# Patient Record
Sex: Male | Born: 1935 | Race: White | Hispanic: No | Marital: Married | Smoking: Never smoker
Health system: Southern US, Community
[De-identification: ages and names within clinical notes are randomized; demographics above are authoritative.]

## PROBLEM LIST (undated history)

## (undated) DIAGNOSIS — K439 Ventral hernia without obstruction or gangrene: Secondary | ICD-10-CM

## (undated) DIAGNOSIS — Z9889 Other specified postprocedural states: Secondary | ICD-10-CM

## (undated) DIAGNOSIS — Z7901 Long term (current) use of anticoagulants: Secondary | ICD-10-CM

## (undated) DIAGNOSIS — Z8719 Personal history of other diseases of the digestive system: Secondary | ICD-10-CM

## (undated) DIAGNOSIS — C61 Malignant neoplasm of prostate: Secondary | ICD-10-CM

## (undated) DIAGNOSIS — I480 Paroxysmal atrial fibrillation: Secondary | ICD-10-CM

## (undated) DIAGNOSIS — I493 Ventricular premature depolarization: Secondary | ICD-10-CM

## (undated) DIAGNOSIS — R5383 Other fatigue: Secondary | ICD-10-CM

## (undated) HISTORY — DX: Personal history of other diseases of the digestive system: Z87.19

## (undated) HISTORY — DX: Other specified postprocedural states: Z98.890

## (undated) HISTORY — DX: Other fatigue: R53.83

## (undated) HISTORY — PX: HERNIA REPAIR: SHX51

## (undated) HISTORY — DX: Long term (current) use of anticoagulants: Z79.01

## (undated) HISTORY — DX: Ventral hernia without obstruction or gangrene: K43.9

## (undated) HISTORY — DX: Malignant neoplasm of prostate: C61

## (undated) HISTORY — DX: Paroxysmal atrial fibrillation: I48.0

## (undated) HISTORY — DX: Ventricular premature depolarization: I49.3

---

## 1995-08-20 DIAGNOSIS — Z9889 Other specified postprocedural states: Secondary | ICD-10-CM

## 1995-08-20 HISTORY — DX: Other specified postprocedural states: Z98.890

## 1995-08-20 HISTORY — PX: MITRAL VALVE REPAIR: SHX2039

## 2001-08-19 HISTORY — PX: RADIOACTIVE SEED IMPLANT: SHX5150

## 2003-11-24 ENCOUNTER — Encounter: Admission: RE | Admit: 2003-11-24 | Discharge: 2003-11-24 | Payer: Self-pay | Admitting: Urology

## 2003-11-24 ENCOUNTER — Ambulatory Visit: Admission: RE | Admit: 2003-11-24 | Discharge: 2004-01-20 | Payer: Self-pay | Admitting: Radiation Oncology

## 2003-12-20 ENCOUNTER — Ambulatory Visit (HOSPITAL_BASED_OUTPATIENT_CLINIC_OR_DEPARTMENT_OTHER): Admission: RE | Admit: 2003-12-20 | Discharge: 2003-12-20 | Payer: Self-pay | Admitting: Urology

## 2003-12-20 ENCOUNTER — Ambulatory Visit (HOSPITAL_COMMUNITY): Admission: RE | Admit: 2003-12-20 | Discharge: 2003-12-20 | Payer: Self-pay | Admitting: Urology

## 2005-10-18 IMAGING — CR DG CHEST 2V
2 series · 2 of 2 positions shown · non-contrast
Comparison: none

CLINICAL DATA: Congestion, cough.  Fever.  Preop respiratory exam for prostate cancer.  CABG.  Former smoker.
CHEST, TWO VIEWS ? 11/24/03 
7 mm ovoid, benign bone island at the posterior right fourth rib or calcified granuloma seen.  Submaximal inspiration is seen with the lungs otherwise clear.  itral valve replacement and sternal wire sutures are noted.  Heart size is normal.  Mediastinum, hila, pleura, and osseous structures are unremarkable.  
IMPRESSION
1.  Right upper lobe benign bone island in rib or calcified granuloma. 
2.  Probable mitral valve replacement.
3.  Otherwise no active disease.

[view not recorded (1 of 2)]
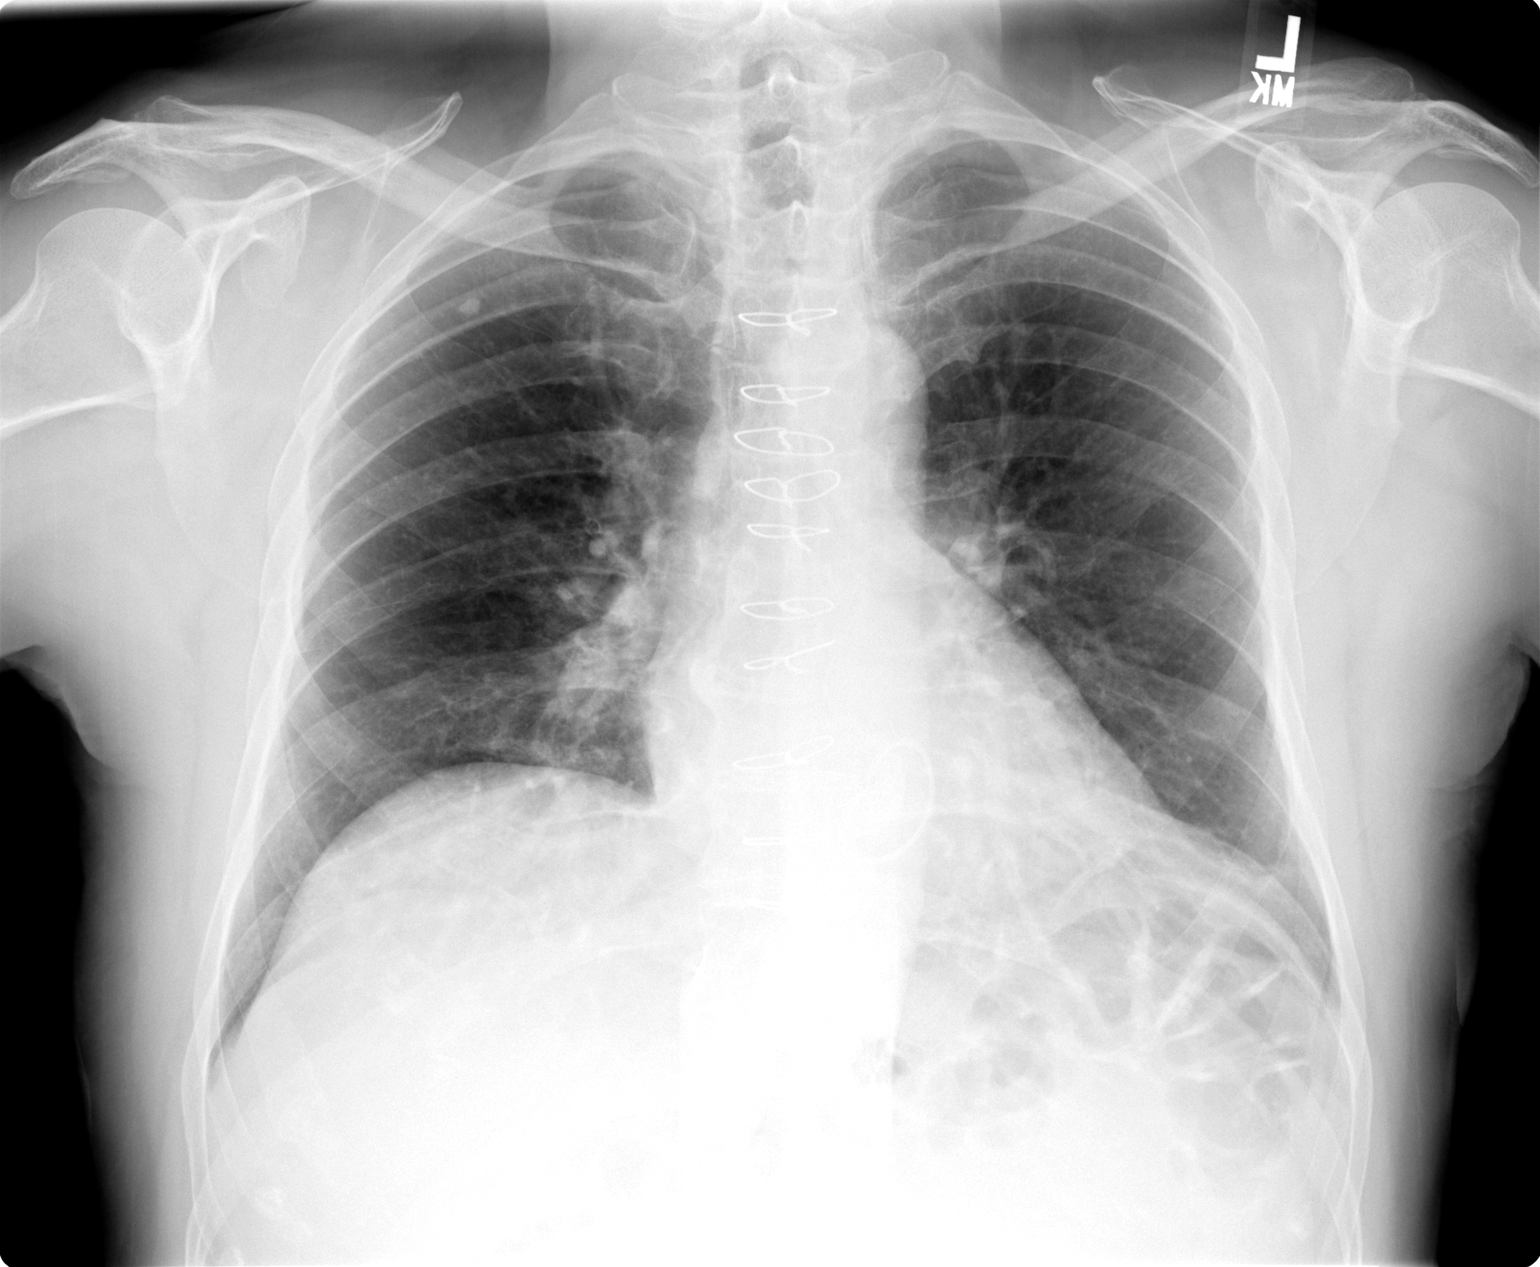

[view not recorded (2 of 2)]
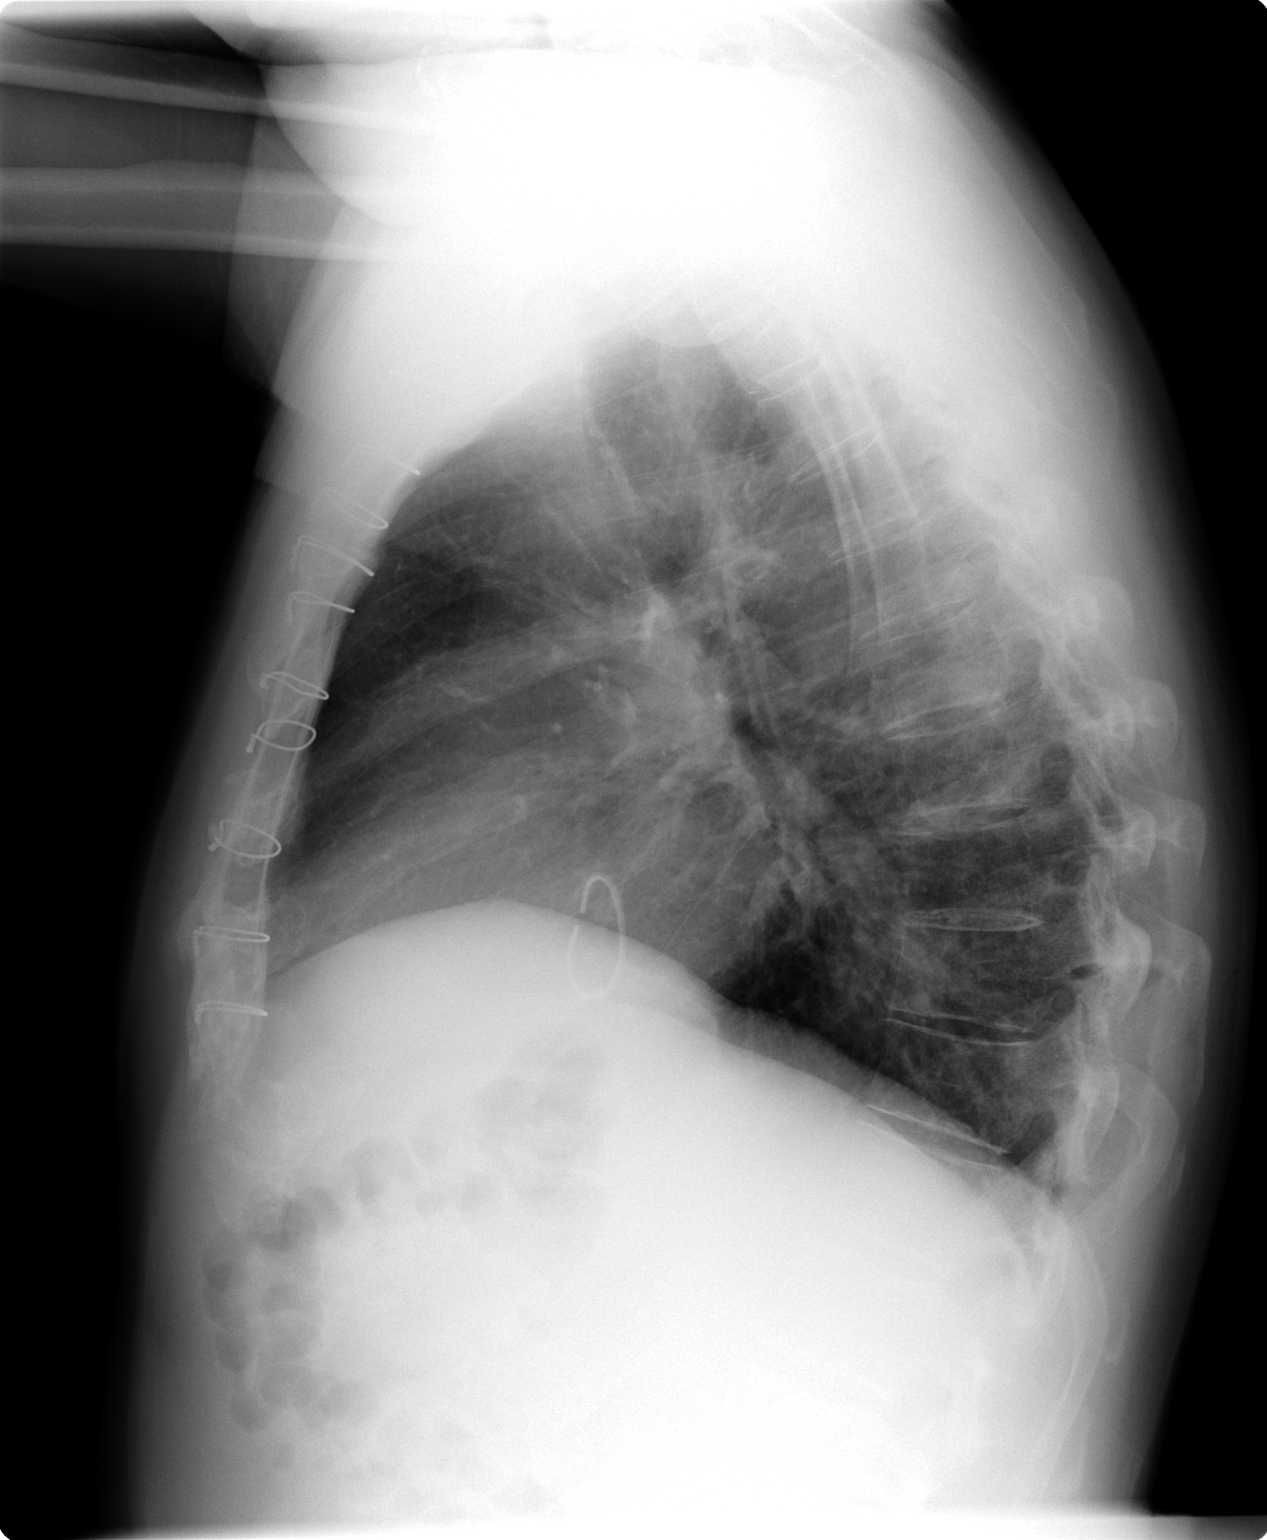

[2 of 2 positions shown; findings below may reference images not displayed]

## 2011-11-08 ENCOUNTER — Encounter (INDEPENDENT_AMBULATORY_CARE_PROVIDER_SITE_OTHER): Payer: Self-pay | Admitting: Surgery

## 2011-11-08 ENCOUNTER — Ambulatory Visit (INDEPENDENT_AMBULATORY_CARE_PROVIDER_SITE_OTHER): Payer: Medicare Other | Admitting: Surgery

## 2011-11-08 VITALS — BP 126/86 | HR 64 | Temp 97.8°F | Resp 12 | Ht 69.0 in | Wt 174.2 lb

## 2011-11-08 DIAGNOSIS — M6208 Separation of muscle (nontraumatic), other site: Secondary | ICD-10-CM

## 2011-11-08 DIAGNOSIS — M6200 Separation of muscle (nontraumatic), unspecified site: Secondary | ICD-10-CM

## 2011-11-08 NOTE — Progress Notes (Signed)
  CC: Possible hernia HPI: This patient had a "flu" illness a few months ago and was doing a lot of coughing. After that he developed were noted a bulge in the epigastric area. If this is actually gotten smaller since he first noticed it. There is no pain. He also has a small "bubble" at the very bottom of a midline sternotomy incision. This has been unchanged since his surgery in 1996 and causes him no problems.   ROS: See EMR  MEDS:  Current Outpatient Prescriptions  Medication Sig Dispense Refill  . allopurinol (ZYLOPRIM) 100 MG tablet daily.      Marland Kitchen esomeprazole (NEXIUM) 20 MG capsule Take 20 mg by mouth as needed.      Marland Kitchen LOVAZA 1 G capsule Three times a day.      . sotalol (BETAPACE) 80 MG tablet BID times 48H.      . warfarin (COUMADIN) 6 MG tablet daily.        ALLERGIES: No Known Allergies    PE VS: BP 126/86  Pulse 64  Temp(Src) 97.8 F (36.6 C) (Temporal)  Resp 12  Ht 5\' 9"  (1.753 m)  Wt 174 lb 3.2 oz (79.017 kg)  BMI 25.72 kg/m2  Gen.: The patient is alert oriented and healthy-appearing Abdomen: The abdomen is soft and completely benign. There are no masses or organomegaly. The area of concern that he points out is diastases recti. There is also a small perhaps 1 cm possible hernia at the bottom of his sternotomy incision.  Data Reviewed No outside information  Assessment Asymptomatic diastases recti  Plan I think he can have normal activities. No surgical intervention is required. []

## 2011-11-08 NOTE — Patient Instructions (Signed)
You may have normal activities and no restrictions

## 2012-04-16 ENCOUNTER — Encounter: Payer: Self-pay | Admitting: Nurse Practitioner

## 2012-04-16 ENCOUNTER — Other Ambulatory Visit: Payer: Self-pay | Admitting: Cardiology

## 2012-04-16 ENCOUNTER — Encounter: Payer: Self-pay | Admitting: Cardiology

## 2012-04-16 ENCOUNTER — Ambulatory Visit (INDEPENDENT_AMBULATORY_CARE_PROVIDER_SITE_OTHER): Payer: Medicare Other | Admitting: Cardiology

## 2012-04-16 VITALS — BP 140/70 | HR 56 | Ht 69.0 in | Wt 170.8 lb

## 2012-04-16 DIAGNOSIS — R0609 Other forms of dyspnea: Secondary | ICD-10-CM

## 2012-04-16 DIAGNOSIS — R5383 Other fatigue: Secondary | ICD-10-CM

## 2012-04-16 DIAGNOSIS — Z9889 Other specified postprocedural states: Secondary | ICD-10-CM

## 2012-04-16 DIAGNOSIS — R0600 Dyspnea, unspecified: Secondary | ICD-10-CM | POA: Insufficient documentation

## 2012-04-16 DIAGNOSIS — I4949 Other premature depolarization: Secondary | ICD-10-CM

## 2012-04-16 DIAGNOSIS — I4891 Unspecified atrial fibrillation: Secondary | ICD-10-CM

## 2012-04-16 DIAGNOSIS — I493 Ventricular premature depolarization: Secondary | ICD-10-CM

## 2012-04-16 DIAGNOSIS — E785 Hyperlipidemia, unspecified: Secondary | ICD-10-CM

## 2012-04-16 DIAGNOSIS — R5381 Other malaise: Secondary | ICD-10-CM

## 2012-04-16 LAB — BASIC METABOLIC PANEL
Calcium: 9.1 mg/dL (ref 8.4–10.5)
Glucose, Bld: 89 mg/dL (ref 70–99)
Potassium: 4.2 mEq/L (ref 3.5–5.1)
Sodium: 138 mEq/L (ref 135–145)

## 2012-04-16 LAB — CBC WITH DIFFERENTIAL/PLATELET
Basophils Absolute: 0 10*3/uL (ref 0.0–0.1)
Hemoglobin: 14.7 g/dL (ref 13.0–17.0)
Lymphocytes Relative: 19.2 % (ref 12.0–46.0)
Lymphs Abs: 1.2 10*3/uL (ref 0.7–4.0)
MCHC: 32.8 g/dL (ref 30.0–36.0)
RBC: 4.95 Mil/uL (ref 4.22–5.81)
RDW: 13.6 % (ref 11.5–14.6)

## 2012-04-16 LAB — PROTIME-INR
INR: 2.46 — ABNORMAL HIGH (ref <1.50)
Prothrombin Time: 27.1 seconds — ABNORMAL HIGH (ref 11.6–15.2)

## 2012-04-16 NOTE — Progress Notes (Signed)
 Ronald Horton Date of Birth: 08/27/1934 Medical Record #[id number]  History of Present Illness: Ronald Horton is a pleasant 76-year-old white male who is self referred for cardiac evaluation. He is a pleasant 76-year-old white male with history of mitral valve prolapse. He is status post mitral valve repair in 1997 at [facility] by Dr. Crosby. He states he did very well with that. He has had a history of atrial fibrillation and was on sotalol 80 mg twice a day until recently. Over the past 2 years he has complained of progressively worse symptoms of palpitations and more recently shortness of breath. With his palpitations he does experience some dizziness and has noticed a significant decrease in his energy. He denies any true syncope. He has had extensive evaluation this year. This includes a stress Myoview study which was felt to be low risk. He has had 3 echocardiograms. These have demonstrated LVH with normal systolic function and elevated left ventricular filling pressures. His mitral valve repair shows no significant regurgitation but he does have leaflet thickening with mild to moderate mitral stenosis. Mean gradients have been from 4.5-7 mm of mercury. Right ventricular systolic pressures have been normal. He also had a Holter monitor placed which demonstrated frequent PVCs with couplets and triplets. There is no evidence of atrial fibrillation. About 2-3 weeks ago he was taken off of sotalol and placed on metoprolol. He thinks his heart rate has actually been slower on this medication and he has noticed no change in his palpitations or other symptoms. He was being evaluated by cardiologist in [place] who recommended cardiac catheterization. The patient desires a second opinion and wished to be treated here.   Current Outpatient Prescriptions on File Prior to Visit  Medication Sig Dispense Refill  . allopurinol (ZYLOPRIM) 100 MG tablet daily.      . esomeprazole  (NEXIUM) 20 MG capsule Take 20 mg by mouth as needed.      . LOVAZA 1 G capsule Three times a day.      . metoprolol (LOPRESSOR) 50 MG tablet Take 75 mg by mouth 2 (two) times daily.      . warfarin (COUMADIN) 6 MG tablet daily.        No Known Allergies  Past Medical History  Diagnosis Date  . Prostate cancer   . PAF (paroxysmal atrial fibrillation)   . History of inguinal hernia     bilateral  . Ventral hernia   . H/O mitral valve repair 1997    with ring  . PVC's (premature ventricular contractions)   . Fatigue   . Chronic anticoagulation     Past Surgical History  Procedure Date  . Mitral valve repair 1997  . Radioactive seed implant 2003  . Hernia repair     BIH    History  Smoking status  . Never Smoker   Smokeless tobacco  . Not on file    History  Alcohol Use  . Yes    Family History  Problem Relation Age of Onset  . Cancer Father     prostate  . Heart disease Father   . Cancer Sister     colon    Review of Systems:  as noted in history of present illness.he reports his last episode of atrial fibrillation was last April. This converted spontaneously. He has never required a DC cardioversion.   All other systems were reviewed and are negative.  Physical Exam: BP 140/70  Pulse 56  Ht 5'   9" (1.753 m)  Wt 170 lb 12.8 oz (77.474 kg)  BMI 25.22 kg/m2  he is a pleasant white male who appears in good health. The patient is alert and oriented x 3.  The mood and affect are normal.  The skin is warm and dry.  Color is normal.  The HEENT exam reveals that the sclera are nonicteric.  The mucous membranes are moist.  The carotids are 2+ without bruits.  There is no thyromegaly.  There is no JVD.  The lungs are clear.  The chest wall is non tender.  The heart exam reveals a regular rate with a normal S1 and S2.  There are no murmurs, gallops, or rubs.  The PMI is not displaced. He has a median sternotomy scar.  Abdominal exam reveals good bowel sounds.  There is  no guarding or rebound.  There is no hepatosplenomegaly or tenderness.  There are no masses.  Exam of the legs reveal no clubbing, cyanosis, or edema.  The legs are without rashes.  The distal pulses are intact.  Cranial nerves II - XII are intact.  Motor and sensory functions are intact.  The gait is normal.  LABORATORY DATA:  ECG demonstrates sinus bradycardia with a rate of 52 and frequent unifocal PVCs. It is otherwise normal.   outside data also indicates a chest CT which showed a calcified granuloma in the right upper lobe. The heart was described as enlarged. No other significant findings. CT of the abdomen was also unremarkable.   Assessment /Plan:  1. PVCs, symptomatic. These are frequent and appeared to be unifocal. He has couplets and triplets. These have not been responsive to beta blocker therapy or sotalol. Depending on the results of his cardiac catheterization will consider referral to EP.  2. Atrial fibrillation. Currently patient is maintaining sinus rhythm off of sotalol.  3. Symptoms of increased dyspnea and fatigue. Need to rule out coronary ischemia. Also further need to evaluate the possibility of mitral stenosis. Right and left heart catheterization has been recommended the patient be scheduled for this procedure next Tuesday. We will need to hold his Coumadin for this procedure.  4. Status post mitral valve repair. Next  5. Hyperlipidemia.   

## 2012-04-17 ENCOUNTER — Telehealth: Payer: Self-pay

## 2012-04-17 DIAGNOSIS — I4891 Unspecified atrial fibrillation: Secondary | ICD-10-CM

## 2012-04-17 NOTE — Telephone Encounter (Signed)
Patient called was told needs to have stat INR done Tuesday 04/21/12 at 8:30 am at Dr.Jordan's office,so results will be ready by cath at 11:30 am.

## 2012-04-21 ENCOUNTER — Encounter (HOSPITAL_BASED_OUTPATIENT_CLINIC_OR_DEPARTMENT_OTHER)
Admission: RE | Disposition: A | Discharge status: Home / Self Care | Payer: Self-pay | Source: Ambulatory Visit | Attending: Cardiology

## 2012-04-21 ENCOUNTER — Inpatient Hospital Stay (HOSPITAL_BASED_OUTPATIENT_CLINIC_OR_DEPARTMENT_OTHER)
Admission: RE | Admit: 2012-04-21 | Discharge: 2012-04-21 | Disposition: A | Discharge status: Home / Self Care | Payer: Medicare Other | Source: Ambulatory Visit | Attending: Cardiology | Admitting: Cardiology

## 2012-04-21 ENCOUNTER — Other Ambulatory Visit: Payer: Medicare Other

## 2012-04-21 ENCOUNTER — Ambulatory Visit (INDEPENDENT_AMBULATORY_CARE_PROVIDER_SITE_OTHER): Payer: Medicare Other | Admitting: *Deleted

## 2012-04-21 DIAGNOSIS — I4891 Unspecified atrial fibrillation: Secondary | ICD-10-CM | POA: Insufficient documentation

## 2012-04-21 DIAGNOSIS — I050 Rheumatic mitral stenosis: Secondary | ICD-10-CM | POA: Insufficient documentation

## 2012-04-21 DIAGNOSIS — I059 Rheumatic mitral valve disease, unspecified: Secondary | ICD-10-CM

## 2012-04-21 DIAGNOSIS — R0600 Dyspnea, unspecified: Secondary | ICD-10-CM

## 2012-04-21 DIAGNOSIS — I4949 Other premature depolarization: Secondary | ICD-10-CM | POA: Insufficient documentation

## 2012-04-21 DIAGNOSIS — Z7901 Long term (current) use of anticoagulants: Secondary | ICD-10-CM | POA: Insufficient documentation

## 2012-04-21 DIAGNOSIS — Z952 Presence of prosthetic heart valve: Secondary | ICD-10-CM | POA: Insufficient documentation

## 2012-04-21 LAB — POCT I-STAT 3, ART BLOOD GAS (G3+)
Bicarbonate: 25.6 mEq/L — ABNORMAL HIGH (ref 20.0–24.0)
O2 Saturation: 93 %
TCO2: 27 mmol/L (ref 0–100)
pCO2 arterial: 45.4 mmHg — ABNORMAL HIGH (ref 35.0–45.0)
pO2, Arterial: 69 mmHg — ABNORMAL LOW (ref 80.0–100.0)

## 2012-04-21 LAB — POCT I-STAT 3, VENOUS BLOOD GAS (G3P V)
Acid-base deficit: 2 mmol/L (ref 0.0–2.0)
O2 Saturation: 67 %

## 2012-04-21 LAB — PROTIME-INR: INR: 1 ratio (ref 0.8–1.0)

## 2012-04-21 SURGERY — JV LEFT AND RIGHT HEART CATHETERIZATION WITH CORONARY ANGIOGRAM
Anesthesia: Moderate Sedation

## 2012-04-21 MED ORDER — DIAZEPAM 5 MG PO TABS
5.0000 mg | ORAL_TABLET | ORAL | Status: AC
  Administered 2012-04-21: 5 mg via ORAL

## 2012-04-21 MED ORDER — SODIUM CHLORIDE 0.9 % IV SOLN: 250.0000 mL | INTRAVENOUS | Status: DC | PRN

## 2012-04-21 MED ORDER — SODIUM CHLORIDE 0.9 % IV SOLN
INTRAVENOUS | Status: DC
  Administered 2012-04-21: 10:00:00 via INTRAVENOUS

## 2012-04-21 MED ORDER — ACETAMINOPHEN 325 MG PO TABS: 650.0000 mg | ORAL_TABLET | ORAL | Status: DC | PRN

## 2012-04-21 MED ORDER — SODIUM CHLORIDE 0.9 % IV SOLN: 1.0000 mL/kg/h | INTRAVENOUS | Status: DC

## 2012-04-21 MED ORDER — SODIUM CHLORIDE 0.9 % IJ SOLN: 3.0000 mL | Freq: Two times a day (BID) | INTRAMUSCULAR | Status: DC

## 2012-04-21 MED ORDER — ONDANSETRON HCL 4 MG/2ML IJ SOLN: 4.0000 mg | Freq: Four times a day (QID) | INTRAMUSCULAR | Status: DC | PRN

## 2012-04-21 MED ORDER — SODIUM CHLORIDE 0.9 % IJ SOLN: 3.0000 mL | INTRAMUSCULAR | Status: DC | PRN

## 2012-04-21 NOTE — CV Procedure (Signed)
   Cardiac Catheterization Procedure Note  Name: Ronald Horton MRN: [id number] DOB: 1935/06/23  Procedure: Right Heart Cath, Left Heart Cath, Selective Coronary Angiography, LV angiography  Indication: 77 year old white male status post mitral valve repair for mitral valve prolapse presents with increased symptoms related to frequent PVCs. Echocardiogram that demonstrated mild to moderate mitral stenosis.   Procedural Details: The right groin was prepped, draped, and anesthetized with 1% lidocaine. Using the modified Seldinger technique a 4 French sheath was placed in the right femoral artery and a 7 French sheath was placed in the right femoral vein. A Swan-Ganz catheter was used for the right heart catheterization. Standard protocol was followed for recording of right heart pressures and sampling of oxygen saturations. Fick cardiac output was calculated. Standard Judkins catheters were used for selective coronary angiography and left ventriculography. There were no immediate procedural complications. The patient was transferred to the post catheterization recovery area for further monitoring.  Procedural Findings: Hemodynamics RA 7/7 with a mean of 4 mmHg RV 37/6 mmHg PA 38/10 with a mean of 23 mmHg PCWP 18/24 with a mean of 16 mmHg LV 127/17 mmHg AO 129/62 with a mean of 87 mmHg  Mean mitral valve gradient is 3 mmHg and calculated valve area of 1.41 cm square.  Oxygen saturations: PA 67% AO 93%  Cardiac Output (Fick) 4.6 L per minute  Cardiac Index (Fick) 2.4 L per minute per meter square   Coronary angiography: Coronary dominance: right  Left mainstem: Normal  Left anterior descending (LAD): Normal  Left circumflex (LCx): Normal  Right coronary artery (RCA): Normal  Left ventriculography: Left ventricular systolic function is normal, LVEF is estimated at 55-65%, there is no significant mitral regurgitation   Final Conclusions:   1. Normal coronary anatomy. 2. Normal  left ventricular function. 3. Normal right heart pressures. 4. Mild to moderate mitral stenosis. 5. No significant mitral insufficiency.  Recommendations: Continue medical management. Consider EP evaluation for management of PVCs.   Theron Arista [facility] 04/21/2012, 12:08 PM

## 2012-04-21 NOTE — H&P (View-Only) (Signed)
Kristin Bruins Date of Birth: 1935-07-14 Medical Record #[id number]  History of Present Illness: Mr. Diloreto is a pleasant 77 year old white male who is self referred for cardiac evaluation. He is a pleasant 77 year old white male with history of mitral valve prolapse. He is status post mitral valve repair in 1997 at [facility] by Dr. Esmeralda Arthur. He states he did very well with that. He has had a history of atrial fibrillation and was on sotalol 80 mg twice a day until recently. Over the past 2 years he has complained of progressively worse symptoms of palpitations and more recently shortness of breath. With his palpitations he does experience some dizziness and has noticed a significant decrease in his energy. He denies any true syncope. He has had extensive evaluation this year. This includes a stress Myoview study which was felt to be low risk. He has had 3 echocardiograms. These have demonstrated LVH with normal systolic function and elevated left ventricular filling pressures. His mitral valve repair shows no significant regurgitation but he does have leaflet thickening with mild to moderate mitral stenosis. Mean gradients have been from 4.5-7 mm of mercury. Right ventricular systolic pressures have been normal. He also had a Holter monitor placed which demonstrated frequent PVCs with couplets and triplets. There is no evidence of atrial fibrillation. About 2-3 weeks ago he was taken off of sotalol and placed on metoprolol. He thinks his heart rate has actually been slower on this medication and he has noticed no change in his palpitations or other symptoms. He was being evaluated by cardiologist in [place] who recommended cardiac catheterization. The patient desires a second opinion and wished to be treated here.   Current Outpatient Prescriptions on File Prior to Visit  Medication Sig Dispense Refill  . allopurinol (ZYLOPRIM) 100 MG tablet daily.      Marland Kitchen esomeprazole  (NEXIUM) 20 MG capsule Take 20 mg by mouth as needed.      Marland Kitchen LOVAZA 1 G capsule Three times a day.      . metoprolol (LOPRESSOR) 50 MG tablet Take 75 mg by mouth 2 (two) times daily.      Marland Kitchen warfarin (COUMADIN) 6 MG tablet daily.        No Known Allergies  Past Medical History  Diagnosis Date  . Prostate cancer   . PAF (paroxysmal atrial fibrillation)   . History of inguinal hernia     bilateral  . Ventral hernia   . H/O mitral valve repair 1997    with ring  . PVC's (premature ventricular contractions)   . Fatigue   . Chronic anticoagulation     Past Surgical History  Procedure Date  . Mitral valve repair 1997  . Radioactive seed implant 2003  . Hernia repair     BIH    History  Smoking status  . Never Smoker   Smokeless tobacco  . Not on file    History  Alcohol Use  . Yes    Family History  Problem Relation Age of Onset  . Cancer Father     prostate  . Heart disease Father   . Cancer Sister     colon    Review of Systems:  as noted in history of present illness.he reports his last episode of atrial fibrillation was last April. This converted spontaneously. He has never required a DC cardioversion.   All other systems were reviewed and are negative.  Physical Exam: BP 140/70  Pulse 56  Ht 5'  9" (1.753 m)  Wt 170 lb 12.8 oz (77.474 kg)  BMI 25.22 kg/m2  he is a pleasant white male who appears in good health. The patient is alert and oriented x 3.  The mood and affect are normal.  The skin is warm and dry.  Color is normal.  The HEENT exam reveals that the sclera are nonicteric.  The mucous membranes are moist.  The carotids are 2+ without bruits.  There is no thyromegaly.  There is no JVD.  The lungs are clear.  The chest wall is non tender.  The heart exam reveals a regular rate with a normal S1 and S2.  There are no murmurs, gallops, or rubs.  The PMI is not displaced. He has a median sternotomy scar.  Abdominal exam reveals good bowel sounds.  There is  no guarding or rebound.  There is no hepatosplenomegaly or tenderness.  There are no masses.  Exam of the legs reveal no clubbing, cyanosis, or edema.  The legs are without rashes.  The distal pulses are intact.  Cranial nerves II - XII are intact.  Motor and sensory functions are intact.  The gait is normal.  LABORATORY DATA:  ECG demonstrates sinus bradycardia with a rate of 52 and frequent unifocal PVCs. It is otherwise normal.   outside data also indicates a chest CT which showed a calcified granuloma in the right upper lobe. The heart was described as enlarged. No other significant findings. CT of the abdomen was also unremarkable.   Assessment /Plan:  1. PVCs, symptomatic. These are frequent and appeared to be unifocal. He has couplets and triplets. These have not been responsive to beta blocker therapy or sotalol. Depending on the results of his cardiac catheterization will consider referral to EP.  2. Atrial fibrillation. Currently patient is maintaining sinus rhythm off of sotalol.  3. Symptoms of increased dyspnea and fatigue. Need to rule out coronary ischemia. Also further need to evaluate the possibility of mitral stenosis. Right and left heart catheterization has been recommended the patient be scheduled for this procedure next Tuesday. We will need to hold his Coumadin for this procedure.  4. Status post mitral valve repair. Next  5. Hyperlipidemia.

## 2012-04-21 NOTE — Interval H&P Note (Signed)
History and Physical Interval Note:  04/21/2012 11:27 AM  Ronald Horton  has presented today for surgery, with the diagnosis of sob  The various methods of treatment have been discussed with the patient and family. After consideration of risks, benefits and other options for treatment, the patient has consented to  Procedure(s) (LRB) with comments: JV LEFT AND RIGHT HEART CATHETERIZATION WITH CORONARY ANGIOGRAM (N/A) as a surgical intervention .  The patient's history has been reviewed, patient examined, no change in status, stable for surgery.  I have reviewed the patient's chart and labs.  Questions were answered to the patient's satisfaction.     Theron Arista [facility] 04/21/2012 11:27 AM

## 2012-04-21 NOTE — Progress Notes (Signed)
Bedrest begins @ 1220. Tegaderm dressing applied to right groin which is level 0 by Venda Rodes.

## 2012-05-04 ENCOUNTER — Encounter: Payer: Medicare Other | Admitting: Nurse Practitioner

## 2012-05-05 ENCOUNTER — Ambulatory Visit (INDEPENDENT_AMBULATORY_CARE_PROVIDER_SITE_OTHER): Payer: Medicare Other | Admitting: Internal Medicine

## 2012-05-05 ENCOUNTER — Encounter: Payer: Self-pay | Admitting: Internal Medicine

## 2012-05-05 VITALS — BP 122/72 | HR 47 | Ht 69.0 in | Wt 173.0 lb

## 2012-05-05 DIAGNOSIS — Z9889 Other specified postprocedural states: Secondary | ICD-10-CM

## 2012-05-05 DIAGNOSIS — I4891 Unspecified atrial fibrillation: Secondary | ICD-10-CM

## 2012-05-05 DIAGNOSIS — I4949 Other premature depolarization: Secondary | ICD-10-CM

## 2012-05-05 DIAGNOSIS — I493 Ventricular premature depolarization: Secondary | ICD-10-CM

## 2012-05-05 MED ORDER — METOPROLOL TARTRATE 50 MG PO TABS: 25.0000 mg | ORAL_TABLET | Freq: Two times a day (BID) | ORAL | Status: DC

## 2012-05-05 NOTE — Patient Instructions (Signed)
Your physician recommends that you schedule a follow-up appointment in: 5-6 weeks with Dr Ladona Ridgel   Your physician has recommended you make the following change in your medication:  1)decrease Metoprolol to 25mg  twice daily

## 2012-05-05 NOTE — Progress Notes (Signed)
HPI [name] is referred today by Norma Fredrickson for evaluation of palpitations, grade cardiac, and frequent PVCs. The patient's history dates back to 1997 when he underwent mitral valve repair. In 2002, he developed atrial fibrillation and was placed on sotalol. He was maintained on sotalol for over 10 years and his symptoms of atrial fibrillation well controlled. In April of 2013, he developed recurrent tachypalpitations and was presumptively diagnosed with atrial fibrillation. He took extra sotalol and his heart return to sinus rhythm. Shortly after this he began to experience a different type of palpitations which have subsequently been diagnosed as PVCs. He has not had syncope. Catheterization demonstrates that his mitral valve repair has remained durable and he does not have coronary disease. He has been placed on escalating doses of metoprolol and is now on 75 mg twice a day. He feels tired and fatigued. He has very little energy despite normal left ventricular systolic function. He has had documented bradycardia. This documentation is calm from frequent checking of his blood pressure and heart rate with his home blood pressure cuff. No Known Allergies   Current Outpatient Prescriptions  Medication Sig Dispense Refill  . allopurinol (ZYLOPRIM) 100 MG tablet Take 100 mg by mouth daily.       Marland Kitchen esomeprazole (NEXIUM) 20 MG capsule Take 20 mg by mouth as needed.      Marland Kitchen LOVAZA 1 G capsule Three times a day.      . metoprolol (LOPRESSOR) 50 MG tablet Take 0.5 tablets (25 mg total) by mouth 2 (two) times daily.      Marland Kitchen warfarin (COUMADIN) 6 MG tablet Take 6 mg by mouth as directed.       Marland Kitchen DISCONTD: metoprolol (LOPRESSOR) 50 MG tablet Take 75 mg by mouth 2 (two) times daily.         Past Medical History  Diagnosis Date  . Prostate cancer   . PAF (paroxysmal atrial fibrillation)   . History of inguinal hernia     bilateral  . Ventral hernia   . H/O mitral valve repair 1997    with ring  .  PVC's (premature ventricular contractions)   . Fatigue   . Chronic anticoagulation     ROS:   All systems reviewed and negative except as noted in the HPI.   Past Surgical History  Procedure Date  . Mitral valve repair 1997  . Radioactive seed implant 2003  . Hernia repair     BIH     Family History  Problem Relation Age of Onset  . Cancer Father     prostate  . Heart disease Father   . Cancer Sister     colon     History   Social History  . Marital Status: Married    Spouse Name: N/A    Number of Children: N/A  . Years of Education: N/A   Occupational History  . Not on file.   Social History Main Topics  . Smoking status: Never Smoker   . Smokeless tobacco: Not on file  . Alcohol Use: Yes  . Drug Use: No  . Sexually Active:    Other Topics Concern  . Not on file   Social History Narrative  . No narrative on file     BP 122/72  Pulse 47  Ht 5\' 9"  (1.753 m)  Wt 173 lb (78.472 kg)  BMI 25.55 kg/m2  SpO2 97%  Physical Exam:  Well appearing 77 year old man, NAD HEENT: Unremarkable Neck:  No  JVD, no thyromegally Lungs:  Clear with no wheezes, rales, or rhonchi. HEART:  IRegular rate rhythm, no murmurs, no rubs, no clicks Abd:  soft, positive bowel sounds, no organomegally, no rebound, no guarding Ext:  2 plus pulses, no edema, no cyanosis, no clubbing Skin:  No rashes no nodules Neuro:  CN II through XII intact, motor grossly intact  EKG Normal sinus rhythm with frequent PVCs. The PVC morphology is monomorphic with a right bundle branch block pattern and a superior axis.  Assess/Plan:

## 2012-05-05 NOTE — Assessment & Plan Note (Signed)
His surgical repair appears to be holding up nicely. He will continue with watchful waiting.

## 2012-05-05 NOTE — Assessment & Plan Note (Signed)
His symptoms appear to be well-controlled. He is on no antiarrhythmic drug therapy. I discussed the importance of drinking only one alcoholic beverage and one caffeinated beverage daily. For now, he will continue his current medical regimen including warfarin. He will reduce his dose of metoprolol to 25 mg twice daily. If he has recurrent symptomatic atrial arrhythmias, inpatient hospitalization for initiation of dofetilide would be a consideration.

## 2012-05-05 NOTE — Assessment & Plan Note (Signed)
I suspect that some of his symptoms are related to his PVCs. 24-hour Holter monitor demonstrated over 15,000 and a 24-hour period. My experience with this density of PVCs is that they are often symptomatic. Because of the uncertainty over whether his PVCs are the predominant cause of his fatigue or whether it is due to beta blocker therapy, I've asked the patient to reduce his dose of beta blocker. If this does not help his symptoms, then we would consider a different antiarrhythmic therapy to control his PVCs as well as his atrial fibrillation. In addition, we discussed the treatment of catheter ablation. Could not recommend this as outlined therapy initially but will consider it if medical therapy failed. Finally, I discussed the benign nature of his PVCs in the setting of normal left ventricular function and in the absence of obstructive coronary disease.

## 2012-05-11 ENCOUNTER — Encounter: Payer: Self-pay | Admitting: Cardiology

## 2012-06-08 ENCOUNTER — Ambulatory Visit: Payer: Medicare Other | Admitting: Internal Medicine

## 2012-06-11 ENCOUNTER — Ambulatory Visit (INDEPENDENT_AMBULATORY_CARE_PROVIDER_SITE_OTHER): Payer: Medicare Other | Admitting: Internal Medicine

## 2012-06-11 ENCOUNTER — Encounter: Payer: Self-pay | Admitting: Internal Medicine

## 2012-06-11 VITALS — BP 133/77 | HR 61 | Ht 69.0 in | Wt 174.2 lb

## 2012-06-11 DIAGNOSIS — I493 Ventricular premature depolarization: Secondary | ICD-10-CM

## 2012-06-11 DIAGNOSIS — I4891 Unspecified atrial fibrillation: Secondary | ICD-10-CM

## 2012-06-11 DIAGNOSIS — I4949 Other premature depolarization: Secondary | ICD-10-CM

## 2012-06-11 MED ORDER — FLECAINIDE ACETATE 100 MG PO TABS: ORAL_TABLET | ORAL | Status: DC

## 2012-06-11 NOTE — Patient Instructions (Signed)
Your physician recommends that you schedule a follow-up appointment in: 4 MONTHS WITH DR Ladona Ridgel  TAKE FLECAINIDE AS NEEDED FOR PERSISTENT PALPITATIONS

## 2012-06-11 NOTE — Assessment & Plan Note (Signed)
His symptoms are improved. He will continue his low dose of metoprolol and I have given him flecainide to take as a pill in the pocket. Will see him back in several months.

## 2012-06-11 NOTE — Assessment & Plan Note (Signed)
He is asymptomatic. No change in medical therapy.

## 2012-06-11 NOTE — Progress Notes (Signed)
HPI Mr. Main returns today for followup. He is a pleasant 77 yo man with a h/o symptomatic PVC's in the setting of preserved LV function. He also has a h/o dyslipidemia. He was seen by me several weeks ago and felt poorly.  It was unclear whether his symptoms were due to his PVC's or to his beta blocker. His PVC's improved markedly when we reduced his beta blocker and he has felt well. He still has occaisional bad days but most of his days are good.  No Known Allergies   Current Outpatient Prescriptions  Medication Sig Dispense Refill  . allopurinol (ZYLOPRIM) 100 MG tablet Take 100 mg by mouth daily.       Marland Kitchen esomeprazole (NEXIUM) 20 MG capsule Take 20 mg by mouth as needed.      Marland Kitchen LOVAZA 1 G capsule Three times a day.      . metoprolol (LOPRESSOR) 50 MG tablet Take 0.5 tablets (25 mg total) by mouth 2 (two) times daily.      Marland Kitchen warfarin (COUMADIN) 6 MG tablet Take 6 mg by mouth as directed.       . flecainide (TAMBOCOR) 100 MG tablet Take as needed for persistent palpitations  30 tablet  12     Past Medical History  Diagnosis Date  . Prostate cancer   . PAF (paroxysmal atrial fibrillation)   . History of inguinal hernia     bilateral  . Ventral hernia   . H/O mitral valve repair 1997    with ring  . PVC's (premature ventricular contractions)   . Fatigue   . Chronic anticoagulation     ROS:   All systems reviewed and negative except as noted in the HPI.   Past Surgical History  Procedure Date  . Mitral valve repair 1997  . Radioactive seed implant 2003  . Hernia repair     BIH     Family History  Problem Relation Age of Onset  . Cancer Father     prostate  . Heart disease Father   . Cancer Sister     colon     History   Social History  . Marital Status: Married    Spouse Name: N/A    Number of Children: N/A  . Years of Education: N/A   Occupational History  . Not on file.   Social History Main Topics  . Smoking status: Never Smoker   . Smokeless  tobacco: Not on file  . Alcohol Use: Yes  . Drug Use: No  . Sexually Active:    Other Topics Concern  . Not on file   Social History Narrative  . No narrative on file     BP 133/77  Pulse 61  Ht 5\' 9"  (1.753 m)  Wt 174 lb 3.2 oz (79.017 kg)  BMI 25.72 kg/m2  Physical Exam:  Well appearing 77 yo man, NAD HEENT: Unremarkable Neck:  No JVD, no thyromegally Lungs:  Clear with no wheezes, rales, or rhonchi HEART:  Regular rate rhythm, no murmurs, no rubs, no clicks Abd:  soft, positive bowel sounds, no organomegally, no rebound, no guarding Ext:  2 plus pulses, no edema, no cyanosis, no clubbing Skin:  No rashes no nodules Neuro:  CN II through XII intact, motor grossly intact   Assess/Plan:

## 2012-10-12 ENCOUNTER — Ambulatory Visit: Payer: Medicare Other | Admitting: Internal Medicine

## 2012-10-14 ENCOUNTER — Encounter: Payer: Self-pay | Admitting: Internal Medicine

## 2012-10-14 ENCOUNTER — Ambulatory Visit (INDEPENDENT_AMBULATORY_CARE_PROVIDER_SITE_OTHER): Payer: Medicare Other | Admitting: Internal Medicine

## 2012-10-14 VITALS — BP 127/78 | HR 55 | Ht 69.0 in | Wt 175.0 lb

## 2012-10-14 DIAGNOSIS — Z9889 Other specified postprocedural states: Secondary | ICD-10-CM

## 2012-10-14 DIAGNOSIS — I4949 Other premature depolarization: Secondary | ICD-10-CM

## 2012-10-14 DIAGNOSIS — I493 Ventricular premature depolarization: Secondary | ICD-10-CM

## 2012-10-14 NOTE — Assessment & Plan Note (Signed)
He is currently asymptomatic with no evidence of heart failure.

## 2012-10-14 NOTE — Progress Notes (Signed)
HPI Mr. Dollins returns today for followup. He is a very pleasant 78 year old man with a history of palpitations and documented PVCs and nonsustained ventricular tachycardia. When I saw him last, his palpitations have increased in frequency and severity, and I recommended that he initiate as needed flecainide therapy. He is currently taking 50 -100 mg of flecainide every other day. His symptoms have been well controlled on this regimen. He is reluctant to take more. He denies chest pain or shortness of breath and no peripheral edema or syncope. No Known Allergies   Current Outpatient Prescriptions  Medication Sig Dispense Refill  . allopurinol (ZYLOPRIM) 100 MG tablet Take 100 mg by mouth daily.       Marland Kitchen esomeprazole (NEXIUM) 20 MG capsule Take 20 mg by mouth as needed.      . flecainide (TAMBOCOR) 100 MG tablet Take as needed for persistent palpitations  30 tablet  12  . LOVAZA 1 G capsule Three times a day.      . metoprolol (LOPRESSOR) 50 MG tablet Take 0.5 tablets (25 mg total) by mouth 2 (two) times daily.      Marland Kitchen warfarin (COUMADIN) 6 MG tablet Take 6 mg by mouth as directed.        No current facility-administered medications for this visit.     Past Medical History  Diagnosis Date  . Prostate cancer   . PAF (paroxysmal atrial fibrillation)   . History of inguinal hernia     bilateral  . Ventral hernia   . H/O mitral valve repair 1997    with ring  . PVC's (premature ventricular contractions)   . Fatigue   . Chronic anticoagulation     ROS:   All systems reviewed and negative except as noted in the HPI.   Past Surgical History  Procedure Laterality Date  . Mitral valve repair  1997  . Radioactive seed implant  2003  . Hernia repair      BIH     Family History  Problem Relation Age of Onset  . Cancer Father     prostate  . Heart disease Father   . Cancer Sister     colon     History   Social History  . Marital Status: Married    Spouse Name: N/A   Number of Children: N/A  . Years of Education: N/A   Occupational History  . Not on file.   Social History Main Topics  . Smoking status: Never Smoker   . Smokeless tobacco: Not on file  . Alcohol Use: Yes  . Drug Use: No  . Sexually Active:    Other Topics Concern  . Not on file   Social History Narrative  . No narrative on file     BP 127/78  Pulse 55  Ht 5\' 9"  (1.753 m)  Wt 175 lb (79.379 kg)  BMI 25.83 kg/m2  Physical Exam:  Well appearing 78 year old man, NAD HEENT: Unremarkable Neck:  7 cm JVD, no thyromegally Lungs:  Clear with no wheezes, rales, or rhonchi. HEART:  Regular rate rhythm, no murmurs, no rubs, no clicks Abd:  soft, positive bowel sounds, no organomegally, no rebound, no guarding Ext:  2 plus pulses, no edema, no cyanosis, no clubbing Skin:  No rashes no nodules Neuro:  CN II through XII intact, motor grossly intact  EKG - normal sinus rhythm with normal axis and intervals.   Assess/Plan:

## 2012-10-14 NOTE — Patient Instructions (Addendum)
Your physician recommends that you continue on your current medications as directed. Please refer to the Current Medication list given to you today.  Your physician wants you to follow-up in: 6 months with Dr. Taylor.  You will receive a reminder letter in the mail two months in advance. If you don't receive a letter, please call our office to schedule the follow-up appointment.  

## 2012-10-14 NOTE — Assessment & Plan Note (Signed)
His symptoms are well-controlled at the present time. I've encouraged the patient to take additional flecainide as needed.

## 2013-04-13 ENCOUNTER — Ambulatory Visit (INDEPENDENT_AMBULATORY_CARE_PROVIDER_SITE_OTHER): Payer: Medicare Other | Admitting: Internal Medicine

## 2013-04-13 ENCOUNTER — Encounter: Payer: Self-pay | Admitting: Internal Medicine

## 2013-04-13 VITALS — BP 117/68 | HR 57 | Ht 69.0 in | Wt 170.8 lb

## 2013-04-13 DIAGNOSIS — I493 Ventricular premature depolarization: Secondary | ICD-10-CM

## 2013-04-13 DIAGNOSIS — I4949 Other premature depolarization: Secondary | ICD-10-CM

## 2013-04-13 DIAGNOSIS — Z9889 Other specified postprocedural states: Secondary | ICD-10-CM

## 2013-04-13 DIAGNOSIS — I4891 Unspecified atrial fibrillation: Secondary | ICD-10-CM

## 2013-04-13 NOTE — Assessment & Plan Note (Signed)
His symptoms are well controlled on low-dose flecainide and beta blocker therapy. No change in medical therapy. I've encouraged the patient to reduce his caffeine intake.

## 2013-04-13 NOTE — Assessment & Plan Note (Signed)
He is maintaining sinus rhythm very nicely. He will continue his current medical therapy.

## 2013-04-13 NOTE — Progress Notes (Signed)
HPI Mr. Laufer returns today for followup. He is a pleasant 78 year old man with a history of symptomatic PVCs and paroxysmal atrial arrhythmias. In the interim, he has been stable. He takes flecainide 100 mg daily along with low-dose metoprolol. He has had no sustained arrhythmias and essentially no palpitations on this regimen. He denies chest pain or shortness of breath. No syncope. No Known Allergies   Current Outpatient Prescriptions  Medication Sig Dispense Refill  . allopurinol (ZYLOPRIM) 100 MG tablet Take 100 mg by mouth daily.       Marland Kitchen esomeprazole (NEXIUM) 20 MG capsule Take 20 mg by mouth as needed.      . flecainide (TAMBOCOR) 100 MG tablet Take as needed for persistent palpitations  30 tablet  12  . LOVAZA 1 G capsule Three times a day.      . metoprolol (LOPRESSOR) 50 MG tablet Take 0.5 tablets (25 mg total) by mouth 2 (two) times daily.      Marland Kitchen warfarin (COUMADIN) 6 MG tablet Take 6 mg by mouth as directed.        No current facility-administered medications for this visit.     Past Medical History  Diagnosis Date  . Prostate cancer   . PAF (paroxysmal atrial fibrillation)   . History of inguinal hernia     bilateral  . Ventral hernia   . H/O mitral valve repair 1997    with ring  . PVC's (premature ventricular contractions)   . Fatigue   . Chronic anticoagulation     ROS:   All systems reviewed and negative except as noted in the HPI.   Past Surgical History  Procedure Laterality Date  . Mitral valve repair  1997  . Radioactive seed implant  2003  . Hernia repair      BIH     Family History  Problem Relation Age of Onset  . Cancer Father     prostate  . Heart disease Father   . Cancer Sister     colon     History   Social History  . Marital Status: Married    Spouse Name: N/A    Number of Children: N/A  . Years of Education: N/A   Occupational History  . Not on file.   Social History Main Topics  . Smoking status: Never Smoker   .  Smokeless tobacco: Not on file  . Alcohol Use: Yes  . Drug Use: No  . Sexual Activity:    Other Topics Concern  . Not on file   Social History Narrative  . No narrative on file     BP 117/68  Pulse 57  Ht 5\' 9"  (1.753 m)  Wt 170 lb 12.8 oz (77.474 kg)  BMI 25.21 kg/m2  Physical Exam:  Well appearing 78 yo man, NAD HEENT: Unremarkable Neck:  No JVD, no thyromegally Back:  No CVA tenderness Lungs:  Clear with no wheezes HEART:  Regular rate rhythm, no murmurs, no rubs, no clicks Abd:  soft, positive bowel sounds, no organomegally, no rebound, no guarding Ext:  2 plus pulses, no edema, no cyanosis, no clubbing Skin:  No rashes no nodules Neuro:  CN II through XII intact, motor grossly intact  EKG sinus bradycardia with left axis deviation and incomplete right bundle branch block   Assess/Plan:

## 2013-04-13 NOTE — Patient Instructions (Addendum)
Your physician wants you to follow-up in: ONE YEAR WITH DR TAYLOR You will receive a reminder letter in the mail two months in advance. If you don't receive a letter, please call our office to schedule the follow-up appointment.  

## 2013-07-05 ENCOUNTER — Other Ambulatory Visit: Payer: Self-pay | Admitting: Internal Medicine

## 2013-08-18 ENCOUNTER — Other Ambulatory Visit: Payer: Self-pay | Admitting: Internal Medicine

## 2013-08-23 ENCOUNTER — Telehealth: Payer: Self-pay | Admitting: Internal Medicine

## 2013-08-23 NOTE — Telephone Encounter (Signed)
New message   C/O issues with Afib. Spoke with neighbor Dr. Servando Snare . They suggest to call the office make them aware.   120/73 . 59 heart rate.   89/64 on Saturday 135 heart rate.

## 2013-08-23 NOTE — Telephone Encounter (Signed)
Sat morning woke up at 7:55 and he was out of rhythm for close to an hour.  HR --135 , 89/64.  He felt weak, otherwise no symptoms.  He took his medications and within 30-45 min he noticed it started to change.  Has had trouble sleeping due to pain in back.  In 12min his rate was 59.  Feels fine now and has since the episode.  I let him know I would discuss with Dr Lovena Le tomorrow but didn't feel like he would make any changes.  He is aware to call me with further problems.  Patient verbalized understanding and was very appreciative of my call

## 2013-08-30 NOTE — Telephone Encounter (Signed)
Discussed with Dr Lovena Le on Friday.  No changes at present patient to call back with further problems

## 2013-10-18 ENCOUNTER — Other Ambulatory Visit: Payer: Self-pay | Admitting: Internal Medicine

## 2013-11-16 ENCOUNTER — Other Ambulatory Visit: Payer: Self-pay | Admitting: Internal Medicine

## 2014-03-29 ENCOUNTER — Other Ambulatory Visit: Payer: Self-pay | Admitting: Internal Medicine

## 2014-04-14 ENCOUNTER — Encounter: Payer: Self-pay | Admitting: Internal Medicine

## 2014-04-14 ENCOUNTER — Ambulatory Visit (INDEPENDENT_AMBULATORY_CARE_PROVIDER_SITE_OTHER): Payer: Medicare Other | Admitting: Internal Medicine

## 2014-04-14 VITALS — BP 118/70 | HR 59 | Ht 69.0 in | Wt 172.0 lb

## 2014-04-14 DIAGNOSIS — I4949 Other premature depolarization: Secondary | ICD-10-CM

## 2014-04-14 DIAGNOSIS — I493 Ventricular premature depolarization: Secondary | ICD-10-CM

## 2014-04-14 DIAGNOSIS — I4891 Unspecified atrial fibrillation: Secondary | ICD-10-CM

## 2014-04-14 NOTE — Progress Notes (Signed)
HPI Ronald Horton returns today for followup. He is a pleasant 79 year old man with a history of symptomatic PVCs and paroxysmal atrial arrhythmias. In the interim, he has been stable. He takes flecainide 100 mg daily along with low-dose metoprolol. He has had no sustained arrhythmias and essentially no palpitations on this regimen. He denies chest pain or shortness of breath. No syncope. He has questions today regarding the use of warfarin versus anticoagulation with one of our novel agents. No Known Allergies   Current Outpatient Prescriptions  Medication Sig Dispense Refill  . allopurinol (ZYLOPRIM) 100 MG tablet Take 100 mg by mouth daily.       Marland Kitchen esomeprazole (NEXIUM) 20 MG capsule Take 20 mg by mouth as needed (heartburn).       . flecainide (TAMBOCOR) 100 MG tablet TAKE AS NEEDED FOR PERSISTENT PALPITATIONS PER DOCTOR INSTRUCTIONS  30 tablet  0  . LOVAZA 1 G capsule Take 1 capsule by mouth 3 (three) times daily.       . metoprolol (LOPRESSOR) 50 MG tablet Take 0.5 tablets (25 mg total) by mouth 2 (two) times daily.      Marland Kitchen warfarin (COUMADIN) 6 MG tablet Take 6 mg by mouth as directed.        No current facility-administered medications for this visit.     Past Medical History  Diagnosis Date  . Prostate cancer   . PAF (paroxysmal atrial fibrillation)   . History of inguinal hernia     bilateral  . Ventral hernia   . H/O mitral valve repair 1997    with ring  . PVC's (premature ventricular contractions)   . Fatigue   . Chronic anticoagulation     ROS:   All systems reviewed and negative except as noted in the HPI.   Past Surgical History  Procedure Laterality Date  . Mitral valve repair  1997  . Radioactive seed implant  2003  . Hernia repair      BIH     Family History  Problem Relation Age of Onset  . Cancer Father     prostate  . Heart disease Father   . Cancer Sister     colon     History   Social History  . Marital Status: Married    Spouse Name:  N/A    Number of Children: N/A  . Years of Education: N/A   Occupational History  . Not on file.   Social History Main Topics  . Smoking status: Never Smoker   . Smokeless tobacco: Not on file  . Alcohol Use: Yes  . Drug Use: No  . Sexual Activity:    Other Topics Concern  . Not on file   Social History Narrative  . No narrative on file     BP 118/70  Pulse 59  Ht 5\' 9"  (1.753 m)  Wt 172 lb (78.019 kg)  BMI 25.39 kg/m2  Physical Exam:  Well appearing 79 yo man, NAD HEENT: Unremarkable Neck:  No JVD, no thyromegally Back:  No CVA tenderness Lungs:  Clear with no wheezes HEART:  Regular rate rhythm, no murmurs, no rubs, no clicks Abd:  soft, positive bowel sounds, no organomegally, no rebound, no guarding Ext:  2 plus pulses, no edema, no cyanosis, no clubbing Skin:  No rashes no nodules Neuro:  CN II through XII intact, motor grossly intact  EKG sinus bradycardia with left axis deviation and incomplete right bundle branch block   Assess/Plan:

## 2014-04-14 NOTE — Assessment & Plan Note (Signed)
He is maintaining sinus rhythm very nicely on flecainide therapy and a beta blocker. He will continue warfarin for now. We discussed the possibility of switching to one of the new novel agents. He is considering his options and will call as if he like to change.

## 2014-04-14 NOTE — Assessment & Plan Note (Signed)
He has had no recurrent PVCs. He is asymptomatic on flecainide. He will continue his current medications and undergo a period of watchful waiting.

## 2014-04-14 NOTE — Patient Instructions (Signed)
Your physician wants you to follow-up in: 12 months with Dr. Taylor. You will receive a reminder letter in the mail two months in advance. If you don't receive a letter, please call our office to schedule the follow-up appointment.    

## 2014-05-30 ENCOUNTER — Other Ambulatory Visit: Payer: Self-pay | Admitting: Internal Medicine

## 2014-08-16 ENCOUNTER — Observation Stay (HOSPITAL_COMMUNITY)
Admission: AD | Admit: 2014-08-16 | Discharge: 2014-08-18 | Disposition: A | Discharge status: Home / Self Care | Payer: Medicare Other | Source: Other Acute Inpatient Hospital | Attending: Internal Medicine | Admitting: Internal Medicine

## 2014-08-16 DIAGNOSIS — R509 Fever, unspecified: Secondary | ICD-10-CM | POA: Diagnosis not present

## 2014-08-16 DIAGNOSIS — Z8546 Personal history of malignant neoplasm of prostate: Secondary | ICD-10-CM | POA: Diagnosis not present

## 2014-08-16 DIAGNOSIS — I480 Paroxysmal atrial fibrillation: Secondary | ICD-10-CM | POA: Diagnosis not present

## 2014-08-16 DIAGNOSIS — R262 Difficulty in walking, not elsewhere classified: Secondary | ICD-10-CM | POA: Insufficient documentation

## 2014-08-16 DIAGNOSIS — R4182 Altered mental status, unspecified: Secondary | ICD-10-CM | POA: Diagnosis not present

## 2014-08-16 DIAGNOSIS — G9340 Encephalopathy, unspecified: Secondary | ICD-10-CM | POA: Diagnosis not present

## 2014-08-16 DIAGNOSIS — R410 Disorientation, unspecified: Secondary | ICD-10-CM | POA: Diagnosis present

## 2014-08-16 DIAGNOSIS — Z9889 Other specified postprocedural states: Secondary | ICD-10-CM

## 2014-08-16 DIAGNOSIS — Z7901 Long term (current) use of anticoagulants: Secondary | ICD-10-CM | POA: Insufficient documentation

## 2014-08-16 DIAGNOSIS — G459 Transient cerebral ischemic attack, unspecified: Secondary | ICD-10-CM | POA: Diagnosis present

## 2014-08-16 DIAGNOSIS — I482 Chronic atrial fibrillation: Secondary | ICD-10-CM

## 2014-08-16 DIAGNOSIS — I4891 Unspecified atrial fibrillation: Secondary | ICD-10-CM | POA: Diagnosis present

## 2014-08-16 MED ORDER — ALLOPURINOL 100 MG PO TABS
100.0000 mg | ORAL_TABLET | Freq: Every day | ORAL | Status: DC
  Administered 2014-08-17: 100 mg via ORAL
  Filled 2014-08-16: qty 1

## 2014-08-16 MED ORDER — OMEGA-3-ACID ETHYL ESTERS 1 G PO CAPS
1.0000 | ORAL_CAPSULE | Freq: Three times a day (TID) | ORAL | Status: DC
  Administered 2014-08-17 (×2): 1 g via ORAL
  Filled 2014-08-16 (×3): qty 1

## 2014-08-16 MED ORDER — STROKE: EARLY STAGES OF RECOVERY BOOK
Freq: Once | Status: AC
  Administered 2014-08-17: 01:00:00
  Filled 2014-08-16: qty 1

## 2014-08-16 MED ORDER — FLECAINIDE ACETATE 100 MG PO TABS
100.0000 mg | ORAL_TABLET | Freq: Every day | ORAL | Status: DC
  Administered 2014-08-17: 100 mg via ORAL
  Filled 2014-08-16 (×2): qty 1

## 2014-08-16 MED ORDER — METOPROLOL TARTRATE 25 MG PO TABS
25.0000 mg | ORAL_TABLET | Freq: Two times a day (BID) | ORAL | Status: DC
  Administered 2014-08-17 (×2): 25 mg via ORAL
  Filled 2014-08-16 (×2): qty 1

## 2014-08-16 MED ORDER — PANTOPRAZOLE SODIUM 40 MG PO TBEC
40.0000 mg | DELAYED_RELEASE_TABLET | Freq: Every day | ORAL | Status: DC
  Administered 2014-08-17: 40 mg via ORAL
  Filled 2014-08-16: qty 1

## 2014-08-16 NOTE — H&P (Signed)
[facility] History and Physical  Patient: Ronald Horton  XLK:[id number]  DOB: 18-Apr-1935  DOS: the patient was seen and examined on 08/16/2014 PCP: No primary care provider on file.  Chief Complaint: Memory issues  HPI: Ronald Horton is a 79 y.o. male with Past medical history of paroxysmal atrial fibrillation on flecainide and Coumadin, mitral valve repair. The patient is presenting with complaints of forgetfulness. Today it was his birthday and he was planning to have a dinner with family and friends but when he woke up this morning he was confused as he was asking for dinner preparation in the morning.  Later on he and his wife drew to [place] and their friends felt that he was not acting coherent himself. There was no headache there was no fall no trauma no injury there was no fever no chills no chest pain. There was no blurring of vision there was no dropping of the angle of the mouth or speech difficulty and it was reported. At the time of my evaluation patient denies any fever, chills, headache, cough, chest pain, palpitation, shortness of breath, orthopnea, PND, nausea, vomiting, abdominal pain, diarrhea, constipation, active bleeding, burning urination, dizziness, pedal edema,  focal neurological deficit.  Later on his son mentions that since last to 3 month they have been noticing occasional issues with memory but not to this extent that occurred today. Patient has family history was somewhat disease on his side.  The patient is coming from home And at his baseline independent for most of his ADL.  Review of Systems: as mentioned in the history of present illness.  A Comprehensive review of the other systems is negative.  Past Medical History  Diagnosis Date  . Prostate cancer   . PAF (paroxysmal atrial fibrillation)   . History of inguinal hernia     bilateral  . Ventral hernia   . H/O mitral valve repair 1997    with ring  . PVC's (premature ventricular  contractions)   . Fatigue   . Chronic anticoagulation    Past Surgical History  Procedure Laterality Date  . Mitral valve repair  1997  . Radioactive seed implant  2003  . Hernia repair      BIH   Social History:  reports that he has never smoked. He does not have any smokeless tobacco history on file. He reports that he drinks alcohol. He reports that he does not use illicit drugs.  No Known Allergies  Family History  Problem Relation Age of Onset  . Cancer Father     prostate  . Heart disease Father   . Cancer Sister     colon    Prior to Admission medications   Medication Sig Start Date End Date Taking? Authorizing Provider  flecainide (TAMBOCOR) 100 MG tablet Take 100 mg by mouth daily.   Yes Historical Provider, MD  allopurinol (ZYLOPRIM) 100 MG tablet Take 100 mg by mouth daily.  10/29/11   Historical Provider, MD  esomeprazole (NEXIUM) 20 MG capsule Take 20 mg by mouth as needed (heartburn).     Historical Provider, MD  flecainide (TAMBOCOR) 100 MG tablet TAKE AS NEEDED FOR PERSISTENT PALPITATIONS PER DOCTOR INSTRUCTIONS 06/01/14   Evans Lance, MD  LOVAZA 1 G capsule Take 1 capsule by mouth 3 (three) times daily.  08/31/11   Historical Provider, MD  metoprolol (LOPRESSOR) 50 MG tablet Take 0.5 tablets (25 mg total) by mouth 2 (two) times daily. 05/05/12   Evans Lance,  MD  warfarin (COUMADIN) 6 MG tablet Take 6 mg by mouth as directed.  10/29/11   Historical Provider, MD    Physical Exam: Filed Vitals:   08/16/14 2233  BP: 127/52  Pulse: 86  Temp: 99.2 F (37.3 C)  TempSrc: Oral  Resp: 18  Weight: 79.652 kg (175 lb 9.6 oz)  SpO2: 96%    General: Alert, Awake and Oriented to Time, Place and Person. Appear in mild distress Eyes: PERRL ENT: Oral Mucosa clear moist. Neck: no JVD Cardiovascular: S1 and S2 Present, no Murmur, Peripheral Pulses Present Respiratory: Bilateral Air entry equal and Decreased, Clear to Auscultation, noCrackles, no wheezes Abdomen:  Bowel Sound present, Soft and non tender Skin: no Rash Extremities: no Pedal edema, no calf tenderness Neurologic: Grossly no focal neuro deficit.  Labs on Admission:  CBC: No results for input(s): WBC, NEUTROABS, HGB, HCT, MCV, PLT in the last 168 hours.  CMP     Component Value Date/Time   NA 138 04/16/2012 1138   K 4.2 04/16/2012 1138   CL 102 04/16/2012 1138   CO2 29 04/16/2012 1138   GLUCOSE 89 04/16/2012 1138   BUN 15 04/16/2012 1138   CREATININE 1.0 04/16/2012 1138   CALCIUM 9.1 04/16/2012 1138    No results for input(s): LIPASE, AMYLASE in the last 168 hours. No results for input(s): AMMONIA in the last 168 hours.  No results for input(s): CKTOTAL, CKMB, CKMBINDEX, TROPONINI in the last 168 hours. BNP (last 3 results) No results for input(s): PROBNP in the last 8760 hours.  Radiological Exams on Admission: No results found.  EKG: Independently reviewed. normal sinus rhythm, nonspecific ST and T waves changes.  Assessment/Plan Principal Problem:   Altered mental status Active Problems:   Status post mitral valve repair   Atrial fibrillation   TIA (transient ischemic attack)   1. Altered mental status The patient is presenting with complaints of confusion. Probable etiology include TIA, seizure, early dementia undiagnosed. Currently we will get an MRI of the brain with neurology consultation to rule out TIA. If the workup is negative he will require further workup either as an inpatient or as an outpatient to diagnose secondary causes of memory loss. Serial neuro checks, monitor on telemetry.  2. Atrial fibrillation. Continue Coumadin as well as continue flecainide  Advance goals of care discussion: Full code   Consults: Neurology  DVT Prophylaxis: on chronic anticoagulation Nutrition: Nothing by mouth pending stroke evaluation  Family Communication: Family was present at bedside, opportunity was given to ask question and all questions were answered  satisfactorily at the time of interview. Disposition: Admitted to observation in telemetry unit.  Author: Berle Mull, MD [facility] Pager: [phone] 08/16/2014, 11:44 PM    If 7PM-7AM, please contact night-coverage [email or web address] Password [email or web address]

## 2014-08-16 NOTE — Progress Notes (Signed)
Patient arrived to [facility]. Transporters reported combativeness and increased confusion during ride to [employer]. Patient in pleasant mood upon arrival and family is now at bedside. Admissions paged for patients arrival. Awaiting orders from MD. Will continue to monitor patient closely. Burnell Blanks, RN

## 2014-08-17 ENCOUNTER — Observation Stay (HOSPITAL_COMMUNITY): Payer: Medicare Other

## 2014-08-17 DIAGNOSIS — R509 Fever, unspecified: Secondary | ICD-10-CM

## 2014-08-17 DIAGNOSIS — G9340 Encephalopathy, unspecified: Secondary | ICD-10-CM | POA: Diagnosis not present

## 2014-08-17 DIAGNOSIS — R4182 Altered mental status, unspecified: Secondary | ICD-10-CM | POA: Diagnosis present

## 2014-08-17 DIAGNOSIS — I480 Paroxysmal atrial fibrillation: Secondary | ICD-10-CM | POA: Diagnosis not present

## 2014-08-17 DIAGNOSIS — G459 Transient cerebral ischemic attack, unspecified: Secondary | ICD-10-CM

## 2014-08-17 DIAGNOSIS — I059 Rheumatic mitral valve disease, unspecified: Secondary | ICD-10-CM

## 2014-08-17 LAB — COMPREHENSIVE METABOLIC PANEL
ALBUMIN: 3.5 g/dL (ref 3.5–5.2)
ALK PHOS: 43 U/L (ref 39–117)
ALT: 21 U/L (ref 0–53)
ANION GAP: 8 (ref 5–15)
AST: 25 U/L (ref 0–37)
BILIRUBIN TOTAL: 0.9 mg/dL (ref 0.3–1.2)
BUN: 11 mg/dL (ref 6–23)
CO2: 23 mmol/L (ref 19–32)
CREATININE: 0.93 mg/dL (ref 0.50–1.35)
Calcium: 8.8 mg/dL (ref 8.4–10.5)
Chloride: 105 mEq/L (ref 96–112)
GFR calc Af Amer: >90 mL/min (ref >90)
GFR, EST NON AFRICAN AMERICAN: 79 mL/min — AB (ref >90)
Glucose, Bld: 112 mg/dL — ABNORMAL HIGH (ref 70–99)
POTASSIUM: 3.6 mmol/L (ref 3.5–5.1)
Sodium: 136 mmol/L (ref 135–145)
Total Protein: 6.4 g/dL (ref 6.0–8.3)

## 2014-08-17 LAB — RAPID URINE DRUG SCREEN, HOSP PERFORMED
Amphetamines: NOT DETECTED
Barbiturates: NOT DETECTED
Benzodiazepines: NOT DETECTED
Cocaine: NOT DETECTED
OPIATES: NOT DETECTED
Tetrahydrocannabinol: NOT DETECTED

## 2014-08-17 LAB — URINE MICROSCOPIC-ADD ON

## 2014-08-17 LAB — URINALYSIS, ROUTINE W REFLEX MICROSCOPIC
BILIRUBIN URINE: NEGATIVE
GLUCOSE, UA: NEGATIVE mg/dL
KETONES UR: NEGATIVE mg/dL
LEUKOCYTES UA: NEGATIVE
NITRITE: NEGATIVE
PROTEIN: NEGATIVE mg/dL
Specific Gravity, Urine: 1.019 (ref 1.005–1.030)
UROBILINOGEN UA: 0.2 mg/dL (ref 0.0–1.0)
pH: 5 (ref 5.0–8.0)

## 2014-08-17 LAB — PROTIME-INR
INR: 3.12 — ABNORMAL HIGH (ref 0.00–1.49)
Prothrombin Time: 32.3 seconds — ABNORMAL HIGH (ref 11.6–15.2)

## 2014-08-17 LAB — GLUCOSE, CAPILLARY
GLUCOSE-CAPILLARY: 114 mg/dL — AB (ref 70–99)
GLUCOSE-CAPILLARY: 116 mg/dL — AB (ref 70–99)
Glucose-Capillary: 122 mg/dL — ABNORMAL HIGH (ref 70–99)
Glucose-Capillary: 104 mg/dL — ABNORMAL HIGH (ref 70–99)

## 2014-08-17 LAB — LIPID PANEL
CHOL/HDL RATIO: 5.1 ratio
Cholesterol: 249 mg/dL — ABNORMAL HIGH (ref 0–200)
HDL: 49 mg/dL (ref >39)
LDL CALC: 164 mg/dL — AB (ref 0–99)
Triglycerides: 181 mg/dL — ABNORMAL HIGH (ref <150)
VLDL: 36 mg/dL (ref 0–40)

## 2014-08-17 LAB — HEMOGLOBIN A1C
Hgb A1c MFr Bld: 5.7 % — ABNORMAL HIGH (ref <5.7)
MEAN PLASMA GLUCOSE: 117 mg/dL — AB (ref <117)

## 2014-08-17 LAB — CBC
HEMATOCRIT: 44.8 % (ref 39.0–52.0)
Hemoglobin: 14.7 g/dL (ref 13.0–17.0)
MCH: 30.2 pg (ref 26.0–34.0)
MCHC: 32.8 g/dL (ref 30.0–36.0)
MCV: 92.2 fL (ref 78.0–100.0)
Platelets: 137 10*3/uL — ABNORMAL LOW (ref 150–400)
RBC: 4.86 MIL/uL (ref 4.22–5.81)
RDW: 13.8 % (ref 11.5–15.5)
WBC: 9.9 10*3/uL (ref 4.0–10.5)

## 2014-08-17 MED ORDER — WARFARIN SODIUM 5 MG PO TABS
5.0000 mg | ORAL_TABLET | Freq: Once | ORAL | Status: AC
  Administered 2014-08-17: 5 mg via ORAL
  Filled 2014-08-17: qty 1

## 2014-08-17 MED ORDER — WARFARIN - PHARMACIST DOSING INPATIENT: Freq: Every day | Status: DC

## 2014-08-17 MED ORDER — ACETAMINOPHEN 325 MG PO TABS
650.0000 mg | ORAL_TABLET | Freq: Four times a day (QID) | ORAL | Status: DC | PRN
  Administered 2014-08-17 (×2): 650 mg via ORAL
  Filled 2014-08-17 (×3): qty 2

## 2014-08-17 NOTE — Progress Notes (Signed)
EEG completed, results pending. 

## 2014-08-17 NOTE — Progress Notes (Signed)
VASCULAR LAB PRELIMINARY  PRELIMINARY  PRELIMINARY  PRELIMINARY  Carotid duplex  completed.    Preliminary report:  Bilateral:  1-39% ICA stenosis.  Vertebral artery flow is antegrade.      Alioune Hodgkin, RVT 08/17/2014, 4:12 PM

## 2014-08-17 NOTE — Evaluation (Signed)
Physical Therapy Evaluation Patient Details Name: Ronald Horton MRN: [id number] DOB: June 09, 1935 Today's Date: 08/17/2014   History of Present Illness  Patient is a 79 y/o male with PMH of paroxysmal atrial fibrillation on flecainide and Coumadin, mitral valve repair and prostate CA presents with confusion and forgetfulness. Pt awoke on 12/29 (his bday) and was planning to have a dinner with family and friends but when he woke up this morning he was confused as he was asking for dinner preparation in the morning along with word finding difficulties. His wife has noticed slight word finding difficulty since 08/12/2014.  CT head- (-). Workup pending.    Clinical Impression  Patient presents with balance deficits when challenged and memory deficits, more exaggerated/noticable when trying to multi-task while performing functional tasks/gait. Tolerated ambulation with head turns, obstacles and changes in gait speed with only mild gait deviations. Pt requires cues to recall familiar information. Pt would benefit from speech consult for further cognitive evaluation and skilled PT to improve higher level balance challenges so pt can safely return to functional independence.    Follow Up Recommendations No PT follow up;Supervision - Intermittent    Equipment Recommendations  None recommended by PT    Recommendations for Other Services       Precautions / Restrictions Precautions Precautions: None Restrictions Weight Bearing Restrictions: No      Mobility  Bed Mobility Overal bed mobility: Independent             General bed mobility comments: HOB flat, no rails.  Transfers Overall transfer level: Needs assistance Equipment used: None Transfers: Sit to/from Stand Sit to Stand: Supervision         General transfer comment: Supervision for safety. Stood from [employer], from chair x2.  Ambulation/Gait Ambulation/Gait assistance: Supervision Ambulation Distance (Feet): 400  Feet Assistive device: None Gait Pattern/deviations: Step-through pattern;Decreased stride length;Drifts right/left     General Gait Details: Pt with steady gait when on straight path however mild deviations in gait noted with balance challenges and head turns. No overt LOB. See balance section.  Stairs Stairs: Yes Stairs assistance: Supervision Stair Management: One rail Right;Alternating pattern Number of Stairs: 11 General stair comments: Supervision for safety.   Wheelchair Mobility    Modified Rankin (Stroke Patients Only)       Balance Overall balance assessment: Needs assistance Sitting-balance support: Feet supported;No upper extremity supported Sitting balance-Leahy Scale: Normal     Standing balance support: During functional activity Standing balance-Leahy Scale: Good   Single Leg Stance - Right Leg: 6 Single Leg Stance - Left Leg: 5 Tandem Stance - Right Leg: 20 (with LOB.) Tandem Stance - Left Leg: 15 (with LOB.) Rhomberg - Eyes Opened: 30 Rhomberg - Eyes Closed: 30 (with increased sway but no LOB.) High level balance activites: Direction changes;Turns;Sudden stops;Head turns High Level Balance Comments: Tolerated above higher level balance challenges with drifting noted esp with head turns but no overt LOB. Able to step over objects and navigate obstacles without difficulty.  Standardized Balance Assessment Standardized Balance Assessment : Dynamic Gait Index   Dynamic Gait Index Level Surface: Normal Change in Gait Speed: Normal Gait with Horizontal Head Turns: Mild Impairment Gait with Vertical Head Turns: Normal Gait and Pivot Turn: Mild Impairment Step Over Obstacle: Mild Impairment Step Around Obstacles: Normal Steps: Mild Impairment Total Score: 20       Pertinent Vitals/Pain Pain Assessment: No/denies pain    Home Living Family/patient expects to be discharged to:: Private residence Living Arrangements:  Spouse/significant  other Available Help at Discharge: Family;Available 24 hours/day Type of Home: House Home Access: Stairs to enter Entrance Stairs-Rails: Right Entrance Stairs-Number of Steps: 1 flight Home Layout: One level Home Equipment: None      Prior Function Level of Independence: Independent         Comments: Pt very active PTA- plays golf and shoots for sport.     Hand Dominance        Extremity/Trunk Assessment   Upper Extremity Assessment: Defer to OT evaluation;Overall WFL for tasks assessed           Lower Extremity Assessment: Overall WFL for tasks assessed         Communication   Communication: No difficulties  Cognition Arousal/Alertness: Awake/alert Behavior During Therapy: WFL for tasks assessed/performed Overall Cognitive Status: Impaired/Different from baseline Area of Impairment: Memory;Problem solving     Memory: Decreased short-term memory       Problem Solving: Requires verbal cues General Comments: Pt with difficulty multi tasking - recalling events/dates during mobility. Requires Max cues to state anniversary and increased time.     General Comments General comments (skin integrity, edema, etc.): When removing visual input during balance testing, pt with increased sway and difficulty maintaining balance. Difficulty multitasking- answering memory questions when trying to perform functional tasks.    Exercises        Assessment/Plan    PT Assessment Patient needs continued PT services  PT Diagnosis Difficulty walking   PT Problem List Decreased balance;Decreased cognition  PT Treatment Interventions Balance training;Neuromuscular re-education;Gait training;Therapeutic exercise;Patient/family education;Functional mobility training;Therapeutic activities   PT Goals (Current goals can be found in the Care Plan section) Acute Rehab PT Goals Patient Stated Goal: none stated PT Goal Formulation: With patient Time For Goal Achievement:  08/31/14 Potential to Achieve Goals: Fair    Frequency Min 3X/week   Barriers to discharge        Co-evaluation               End of Session Equipment Utilized During Treatment: Gait belt Activity Tolerance: Patient tolerated treatment well Patient left: in bed;with call bell/phone within reach;with family/visitor present Nurse Communication: Mobility status    Functional Assessment Tool Used: Clinical judgment Functional Limitation: Mobility: Walking and moving around Mobility: Walking and Moving Around Current Status [phone]): At least 1 percent but less than 20 percent impaired, limited or restricted Mobility: Walking and Moving Around Goal Status [phone]): 0 percent impaired, limited or restricted    Time: 1030-1108 PT Time Calculation (min) (ACUTE ONLY): 38 min   Charges:   PT Evaluation $Initial PT Evaluation Tier I: 1 Procedure PT Treatments $Gait Training: 8-22 mins $Neuromuscular Re-education: 8-22 mins   PT G Codes:   PT G-Codes **NOT FOR INPATIENT CLASS** Functional Assessment Tool Used: Clinical judgment Functional Limitation: Mobility: Walking and moving around Mobility: Walking and Moving Around Current Status ([id number]): At least 1 percent but less than 20 percent impaired, limited or restricted Mobility: Walking and Moving Around Goal Status [phone]): 0 percent impaired, limited or restricted    Candy Sledge A 08/17/2014, 1:25 PM  Candy Sledge, PT, DPT [phone]

## 2014-08-17 NOTE — Progress Notes (Signed)
Patient had a temperature of 101.8. RN checked patient, he is asymptomatic and feels fine but Tylenol was given to lower temperature. Will continue to monitor patient closely. Burnell Blanks, RN

## 2014-08-17 NOTE — Progress Notes (Signed)
No VTE prophylaxis needed currently as INR >3 today.  [place], Pharm.D., BCPS Clinical Pharmacist Pager: [phone] 08/17/2014 8:13 AM

## 2014-08-17 NOTE — Consult Note (Signed)
Reason for Consult: Altered mental status.  HPI:                                                                                                                                          Ronald Horton is an 79 y.o. male with a history of atrial fibrillation on anticoagulation with Coumadin, mitral valve repair, prostate cancer and ventral hernia, brought to the emergency room in [place] evaluation for new onset confusion. His wife has noticed slight word finding difficulty since 08/12/2014. This morning he woke up very confused with respect to time in addition to having worsening of naming and word finding difficulty. There was no slurring of speech. CT scan of his head showed no acute intracranial abnormality. No focal weakness was noted. INR was 3.12. Blood pressure was within normal range. Temperature was 99.2 on arrival. WBC count was 9.9.  Past Medical History  Diagnosis Date  . Prostate cancer   . PAF (paroxysmal atrial fibrillation)   . History of inguinal hernia     bilateral  . Ventral hernia   . H/O mitral valve repair 1997    with ring  . PVC's (premature ventricular contractions)   . Fatigue   . Chronic anticoagulation     Past Surgical History  Procedure Laterality Date  . Mitral valve repair  1997  . Radioactive seed implant  2003  . Hernia repair      BIH    Family History  Problem Relation Age of Onset  . Cancer Father     prostate  . Heart disease Father   . Cancer Sister     colon    Social History:  reports that he has never smoked. He does not have any smokeless tobacco history on file. He reports that he drinks alcohol. He reports that he does not use illicit drugs.  No Known Allergies  MEDICATIONS:                                                                                                                     I have reviewed the patient's current medications.   ROS:  History obtained from spouse  General ROS: negative for - chills, fatigue, fever, night sweats, weight gain or weight loss Psychological ROS: As noted in present illness Ophthalmic ROS: negative for - blurry vision, double vision, eye pain or loss of vision ENT ROS: negative for - epistaxis, nasal discharge, oral lesions, sore throat, tinnitus or vertigo Allergy and Immunology ROS: negative for - hives or itchy/watery eyes Hematological and Lymphatic ROS: negative for - bleeding problems, bruising or swollen lymph nodes Endocrine ROS: negative for - galactorrhea, hair pattern changes, polydipsia/polyuria or temperature intolerance Respiratory ROS: negative for - cough, hemoptysis, shortness of breath or wheezing Cardiovascular ROS: negative for - chest pain, dyspnea on exertion, edema or irregular heartbeat Gastrointestinal ROS: negative for - abdominal pain, diarrhea, hematemesis, nausea/vomiting or stool incontinence Genito-Urinary ROS: negative for - dysuria, hematuria, incontinence or urinary frequency/urgency Musculoskeletal ROS: negative for - joint swelling or muscular weakness Neurological ROS: as noted in HPI Dermatological ROS: negative for rash and skin lesion changes   Blood pressure 127/52, pulse 86, temperature 99.2 F (37.3 C), temperature source Oral, resp. rate 18, weight 79.652 kg (175 lb 9.6 oz), SpO2 96 %. Appearance was that of slightly obese elderly man who was alert and in no acute distress. HEENT: Unremarkable Neck was supple with no tenderness. Heart rate was normal. Rhythm was irregular, irregular. Heart sounds were normal. Extremities were normal in appearance with no edema nor significant discoloration.  Neurologic Examination:                                                                                                      Mental Status: Alert, disoriented to his correct age as well as correct  year; patient tended to confabulate.  Speech fluent without evidence of aphasia. Able to follow commands without difficulty. Cranial Nerves: II-Visual fields were normal. III/IV/VI-Pupils were equal and reacted. Extraocular movements were full and conjugate.    V/VII-no facial numbness and no facial weakness. VIII-normal. X-normal speech and symmetrical palatal movement. Motor: 5/5 bilaterally with normal tone and bulk Sensory: Normal throughout. Deep Tendon Reflexes: Trace to 1+ and symmetric in upper extremities and absent in lower extremities. Plantars: Flexor bilaterally Cerebellar: Normal finger-to-nose testing. Carotid auscultation: Normal  No results found for: CHOL  Results for orders placed or performed during the hospital encounter of 08/16/14 (from the past 48 hour(s))  Comprehensive metabolic panel     Status: Abnormal   Collection Time: 08/17/14 12:05 AM  Result Value Ref Range   Sodium 136 135 - 145 mmol/L    Comment: Please note change in reference range.   Potassium 3.6 3.5 - 5.1 mmol/L    Comment: Please note change in reference range.   Chloride 105 96 - 112 mEq/L   CO2 23 19 - 32 mmol/L   Glucose, Bld 112 (H) 70 - 99 mg/dL   BUN 11 6 - 23 mg/dL   Creatinine, Ser 0.93 0.50 - 1.35 mg/dL   Calcium 8.8 8.4 - 10.5 mg/dL   Total Protein 6.4 6.0 - 8.3 g/dL   Albumin 3.5 3.5 - 5.2  g/dL   AST 25 0 - 37 U/L   ALT 21 0 - 53 U/L   Alkaline Phosphatase 43 39 - 117 U/L   Total Bilirubin 0.9 0.3 - 1.2 mg/dL   GFR calc non Af Amer 79 (L) >90 mL/min   GFR calc Af Amer >90 >90 mL/min    Comment: (NOTE) The eGFR has been calculated using the CKD EPI equation. This calculation has not been validated in all clinical situations. eGFR's persistently <90 mL/min signify possible Chronic Kidney Disease.    Anion gap 8 5 - 15  CBC     Status: Abnormal   Collection Time: 08/17/14 12:05 AM  Result Value Ref Range   WBC 9.9 4.0 - 10.5 K/uL   RBC 4.86 4.22 - 5.81 MIL/uL    Hemoglobin 14.7 13.0 - 17.0 g/dL   HCT 44.8 39.0 - 52.0 %   MCV 92.2 78.0 - 100.0 fL   MCH 30.2 26.0 - 34.0 pg   MCHC 32.8 30.0 - 36.0 g/dL   RDW 13.8 11.5 - 15.5 %   Platelets 137 (L) 150 - 400 K/uL  Protime-INR     Status: Abnormal   Collection Time: 08/17/14 12:05 AM  Result Value Ref Range   Prothrombin Time 32.3 (H) 11.6 - 15.2 seconds   INR 3.12 (H) 0.00 - 1.49    No results found.   Assessment/Plan: 79 year old man presenting with altered mental status with confusion and word finding difficulty. Etiology is unclear. Given the acuteness of his status changes and worsening of word finding difficulty, acute stroke cannot be ruled out. Significance of low-grade temp is unclear. Patient has no focal deficits. There is no history of dementia.  Recommendations: 1. MRI of the brain without contrast to rule out possible acute stroke. 2. Stroke workup and risk assessment if MRI shows acute stroke, to include 2-D echocardiogram, carotid Doppler study, fasting lipid panel and hemoglobin A1c. 3. Continue Coumadin for anticoagulation. 4. EEG, routine adult study.  We will continue to follow this patient with you.  C.R. Nicole Kindred, MD [facility] [phone]  08/17/2014, 12:43 AM

## 2014-08-17 NOTE — Progress Notes (Signed)
[facility]                                                                              Patient Demographics  Ronald Horton, is a 79 y.o. male, DOB - [birth year], VVO:[id number]  Admit date - 08/16/2014   Admitting Physician Berle Mull, MD  Outpatient Primary MD for the patient is No primary care provider on file.  LOS - 1   No chief complaint on file.     HPI on 08/16/2014 by Dr. Berle Mull Ronald Horton is a 79 y.o. male with Past medical history of paroxysmal atrial fibrillation on flecainide and Coumadin, mitral valve repair. The patient is presenting with complaints of forgetfulness. Today it was his birthday and he was planning to have a dinner with family and friends but when he woke up this morning he was confused as he was asking for dinner preparation in the morning.  Later on he and his wife drew to [place] and their friends felt that he was not acting coherent himself. There was no headache there was no fall no trauma no injury there was no fever no chills no chest pain. There was no blurring of vision there was no dropping of the angle of the mouth or speech difficulty and it was reported. At the time of my evaluation patient denies any fever, chills, headache, cough, chest pain, palpitation, shortness of breath, orthopnea, PND, nausea, vomiting, abdominal pain, diarrhea, constipation, active bleeding, burning urination, dizziness, pedal edema, focal neurological deficit. Later on his son mentions that since last to 3 month they have been noticing occasional issues with memory but not to this extent that occurred today. Patient has family history was somewhat disease on his side.  Assessment & Plan   Acute encephalopathy -Possibly secondary to TIA vs Seizure vs undiagnosed dementia -Neurology consulted and appreciated, recommended MRI of the brain, EEG -Continue neuro checks -UA negative for infection, no leukocytosis  Paroxysmal atrial fibrillation -Continue  flecainide and Coumadin per pharmacy  Fever -No infectious etiology found at this time, UA negative, no leukocytosis -Will continue to monitor  Code Status: Full  Family Communication: Wife at bedside  Disposition Plan: Admitted  Time Spent in minutes   30 minutes  Procedures  None  Consults   Neurology  DVT Prophylaxis  Coumadin  Lab Results  Component Value Date   PLT 137* 08/17/2014    Medications  Scheduled Meds: . allopurinol  100 mg Oral Daily  . flecainide  100 mg Oral Daily  . metoprolol  25 mg Oral BID  . omega-3 acid ethyl esters  1 capsule Oral TID  . pantoprazole  40 mg Oral Daily   Continuous Infusions:  PRN Meds:.acetaminophen  Antibiotics    Anti-infectives    None        Subjective:   Ronald Horton seen and examined today.  Patient states he cannot remember anything. Patient currently denies chest pain, shortness of breath, dizziness, headache.   Objective:   Filed Vitals:   08/16/14 2233 08/17/14 0121 08/17/14 0531 08/17/14 1040  BP: 127/52 112/60 105/46 110/63  Pulse: 86 71 94 71  Temp: 99.2 F (37.3 C) 98.5 F (36.9 C)  100.8 F (38.2 C) 97.8 F (36.6 C)  TempSrc: Oral Oral Oral Oral  Resp: 18 18 20 18   Weight: 79.652 kg (175 lb 9.6 oz)     SpO2: 96% 98% 94% 94%    Wt Readings from Last 3 Encounters:  08/16/14 79.652 kg (175 lb 9.6 oz)  04/14/14 78.019 kg (172 lb)  04/13/13 77.474 kg (170 lb 12.8 oz)     Intake/Output Summary (Last 24 hours) at 08/17/14 1234 Last data filed at 08/17/14 0900  Gross per 24 hour  Intake    120 ml  Output      1 ml  Net    119 ml    Exam  General: Well developed, well nourished, NAD, appears stated age  HEENT: NCAT, mucous membranes moist.   Cardiovascular: S1 S2 auscultated, no rubs, murmurs or gallops. Regular rate and rhythm.  Respiratory: Clear to auscultation bilaterally with equal chest rise  Abdomen: Soft, nontender, nondistended, + bowel sounds  Extremities: warm dry  without cyanosis clubbing or edema  Neuro: Awake, alert, not oriented.  Patient able to answer simple questions however cannot recall his own birthdate. He did know that his wife is at bedside. No focal deficits otherwise.  Psych: Appropriate mood and affect  Data Review   Micro Results No results found for this or any previous visit (from the past 240 hour(s)).  Radiology Reports No results found.  CBC  Recent Labs Lab 08/17/14 0005  WBC 9.9  HGB 14.7  HCT 44.8  PLT 137*  MCV 92.2  MCH 30.2  MCHC 32.8  RDW 13.8    Chemistries   Recent Labs Lab 08/17/14 0005  NA 136  K 3.6  CL 105  CO2 23  GLUCOSE 112*  BUN 11  CREATININE 0.93  CALCIUM 8.8  AST 25  ALT 21  ALKPHOS 43  BILITOT 0.9   ------------------------------------------------------------------------------------------------------------------ estimated creatinine clearance is 66.5 mL/min (by C-G formula based on Cr of 0.93). ------------------------------------------------------------------------------------------------------------------  Recent Labs  08/17/14 0005  HGBA1C 5.7*   ------------------------------------------------------------------------------------------------------------------  Recent Labs  08/17/14 0005  CHOL 249*  HDL 49  LDLCALC 164*  TRIG 181*  CHOLHDL 5.1   ------------------------------------------------------------------------------------------------------------------ No results for input(s): TSH, T4TOTAL, T3FREE, THYROIDAB in the last 72 hours.  Invalid input(s): FREET3 ------------------------------------------------------------------------------------------------------------------ No results for input(s): VITAMINB12, FOLATE, FERRITIN, TIBC, IRON, RETICCTPCT in the last 72 hours.  Coagulation profile  Recent Labs Lab 08/17/14 0005  INR 3.12*    No results for input(s): DDIMER in the last 72 hours.  Cardiac Enzymes No results for input(s): CKMB, TROPONINI,  MYOGLOBIN in the last 168 hours.  Invalid input(s): CK ------------------------------------------------------------------------------------------------------------------ Invalid input(s): POCBNP    Frandy Basnett D.O. on 08/17/2014 at 12:34 PM  Between 7am to 7pm - Pager - [phone]  After 7pm go to [email or web address] - password [email or web address]  And look for the night coverage person covering for me after hours  [facility] Office  [phone]

## 2014-08-17 NOTE — Progress Notes (Signed)
UR completed 

## 2014-08-17 NOTE — Procedures (Signed)
ELECTROENCEPHALOGRAM REPORT   Patient: Ronald Horton       Room #: 7R91  EEG No. ID: [id number] Age: 79 y.o.        Sex: male Referring Physician: Ree Kida Report Date:  08/17/2014        Interpreting Physician: Alexis Goodell  History: Ronald Horton is an 79 y.o. male with new onset confusion  Medications:  Scheduled: . allopurinol  100 mg Oral Daily  . flecainide  100 mg Oral Daily  . metoprolol  25 mg Oral BID  . omega-3 acid ethyl esters  1 capsule Oral TID  . pantoprazole  40 mg Oral Daily    Conditions of Recording:  This is a 16 channel EEG carried out with the patient in the awake, drowsy and asleep states.  Description:  The waking background activity is only noted briefly but consists of a low voltage, symmetrical, fairly well organized, 8 Hz alpha activity, seen from the parieto-occipital and posterior temporal regions.  Low voltage fast activity, poorly organized, is seen anteriorly and is at times superimposed on more posterior regions.  A mixture of theta and alpha rhythms are seen from the central and temporal regions. The patient drowses with slowing to irregular, low voltage theta and beta activity.  No epileptiform activity is noted.  The patient goes in to a light sleep with symmetrical sleep spindles, vertex central sharp transients and irregular slow activity.   Hyperventilation was not performed.  Intermittent photic stimulation was performed but failed to illicit any change in the tracing.    IMPRESSION: Normal electroencephalogram, awake, asleep and with activation procedures. There are no focal lateralizing or epileptiform features.   Alexis Goodell, MD [facility] [phone] 08/17/2014, 12:59 PM

## 2014-08-17 NOTE — Progress Notes (Signed)
ANTICOAGULATION CONSULT NOTE - Initial Consult  Pharmacy Consult for coumadin Indication: atrial fibrillation  No Known Allergies  Patient Measurements: Weight: 175 lb 9.6 oz (79.652 kg) Heparin Dosing Weight:  Vital Signs: Temp: 99.7 F (37.6 C) (12/30 1455) Temp Source: Oral (12/30 1455) BP: 131/75 mmHg (12/30 1455) Pulse Rate: 83 (12/30 1455)  Labs:  Recent Labs  08/17/14 0005  HGB 14.7  HCT 44.8  PLT 137*  LABPROT 32.3*  INR 3.12*  CREATININE 0.93    Estimated Creatinine Clearance: 66.5 mL/min (by C-G formula based on Cr of 0.93).   Medical History: Past Medical History  Diagnosis Date  . Prostate cancer   . PAF (paroxysmal atrial fibrillation)   . History of inguinal hernia     bilateral  . Ventral hernia   . H/O mitral valve repair 1997    with ring  . PVC's (premature ventricular contractions)   . Fatigue   . Chronic anticoagulation     Medications:  Scheduled:  . allopurinol  100 mg Oral Daily  . flecainide  100 mg Oral Daily  . metoprolol  25 mg Oral BID  . omega-3 acid ethyl esters  1 capsule Oral TID  . pantoprazole  40 mg Oral Daily   Infusions:    Assessment: 79 yo who was admitted for memory related issues. She has been on coumadin for PAF/MVR. Her admission INR was 3.12. Her likely goal is prob 2.5-3.5. We'll target for 2.5-3 here. Coumadin is being resumed today. May need slightly lower home dose.   PTA = 6mg  qday except 9mg  Tues/Fri  Goal of Therapy:  INR = 2.5-3 Monitor platelets by anticoagulation protocol: Yes   Plan:   Coumadin 5mg  PO x1 today Daily INR  Onnie Boer, PharmD Pager: [phone] 08/17/2014 4:19 PM

## 2014-08-17 NOTE — Progress Notes (Signed)
OT Cancellation Note  Patient Details Name: Ronald Horton MRN: [id number] DOB: 11-20-1934   Cancelled Treatment:    Reason Eval/Treat Not Completed: Patient at procedure or test/ unavailable. Will re attempt later today as appropriate/as time allows  Britt Bottom 08/17/2014, 9:08 AM

## 2014-08-18 DIAGNOSIS — G9340 Encephalopathy, unspecified: Secondary | ICD-10-CM | POA: Diagnosis not present

## 2014-08-18 DIAGNOSIS — R509 Fever, unspecified: Secondary | ICD-10-CM | POA: Insufficient documentation

## 2014-08-18 DIAGNOSIS — R410 Disorientation, unspecified: Secondary | ICD-10-CM

## 2014-08-18 LAB — PROTIME-INR
INR: 3.16 — ABNORMAL HIGH (ref 0.00–1.49)
PROTHROMBIN TIME: 32.7 s — AB (ref 11.6–15.2)

## 2014-08-18 LAB — GLUCOSE, CAPILLARY: Glucose-Capillary: 139 mg/dL — ABNORMAL HIGH (ref 70–99)

## 2014-08-18 LAB — AMMONIA: Ammonia: 34 umol/L — ABNORMAL HIGH (ref 11–32)

## 2014-08-18 LAB — TSH: TSH: 2.696 u[IU]/mL (ref 0.350–4.500)

## 2014-08-18 LAB — VITAMIN B12: VITAMIN B 12: 306 pg/mL (ref 211–911)

## 2014-08-18 NOTE — Evaluation (Signed)
Occupational Therapy Evaluation Patient Details Name: Ronald Horton MRN: [id number] DOB: [birth year] Today's Date: 08/18/2014    History of Present Illness Patient is a 79 y/o male with PMH of paroxysmal atrial fibrillation on flecainide and Coumadin, mitral valve repair and prostate CA presents with confusion and forgetfulness. Pt awoke on [birth year] (his bday) and was planning to have a dinner with family and friends but when he woke up this morning he was confused as he was asking for dinner preparation in the morning along with word finding difficulties. His wife has noticed slight word finding difficulty since 08/12/2014.  CT head- (-). Workup pending.   Clinical Impression   Patient evaluated by Occupational Therapy with no further acute OT needs identified. All education has been completed and the patient has no further questions. See below for any follow-up Occupational Therapy or equipment needs. OT to sign off. Thank you for referral.   Pt and family educated on areas of cognitive deficits and how these relate to everyday life ADLS and IADLS. Wife to help with medications at this time due to memory deficits. Pt advised no driving until cleared by MD and first attempts at driving should only be in short distance/ familiar locations. Pt with delayed response to new information and delayed processing in a car could incr risk for injury. Family and pt expressed understanding and to make accommodations at needed.     Follow Up Recommendations  No OT follow up    Equipment Recommendations       Recommendations for Other Services       Precautions / Restrictions Precautions Precautions: None      Mobility Bed Mobility Overal bed mobility: Independent                Transfers                      Balance                                            ADL Overall ADL's : At baseline                                              Vision                     Perception     Praxis      Pertinent Vitals/Pain Pain Assessment: No/denies pain     Hand Dominance Right   Extremity/Trunk Assessment Upper Extremity Assessment Upper Extremity Assessment: Overall WFL for tasks assessed   Lower Extremity Assessment Lower Extremity Assessment: Overall WFL for tasks assessed   Cervical / Trunk Assessment Cervical / Trunk Assessment: Normal   Communication Communication Communication: No difficulties   Cognition Arousal/Alertness: Awake/alert Behavior During Therapy: WFL for tasks assessed/performed Overall Cognitive Status: Impaired/Different from baseline Area of Impairment: Memory;Problem solving     Memory: Decreased short-term memory         General Comments: Pt provided the [place] with deficits in visuospatial / executive, naming, memory recall, abstract thought and delayed recall.    General Comments       Exercises       Shoulder Instructions      Home Living Family/patient  expects to be discharged to:: Private residence Living Arrangements: Spouse/significant other Available Help at Discharge: Family;Available 24 hours/day Type of Home: House Home Access: Stairs to enter [employer] of Steps: 1 flight Entrance Stairs-Rails: Right Home Layout: One level         Bathroom Toilet: Standard     Home Equipment: None          Prior Functioning/Environment Level of Independence: Independent        Comments: Pt very active PTA- plays golf and shoots for sport.    OT Diagnosis: Cognitive deficits   OT Problem List:     OT Treatment/Interventions:      OT Goals(Current goals can be found in the care plan section) Acute Rehab OT Goals Patient Stated Goal: to return to helping sons with their race cars  OT Frequency:     Barriers to D/C:            Co-evaluation              End of Session Nurse Communication: Precautions  Activity  Tolerance: Patient tolerated treatment well Patient left: in bed;with call bell/phone within reach;with family/visitor present   Time: 0915-1002 OT Time Calculation (min): 47 min Charges:  OT General Charges $OT Visit: 1 Procedure OT Evaluation $Initial OT Evaluation Tier I: 1 Procedure OT Treatments $Cognitive Skills Development: 23-37 mins G-Codes: OT G-codes **NOT FOR INPATIENT CLASS** Functional Assessment Tool Used: clinical judgement Functional Limitation: Self care Self Care Current Status (L8756): 0 percent impaired, limited or restricted Self Care Goal Status (E3329): 0 percent impaired, limited or restricted Self Care Discharge Status (J1884): 0 percent impaired, limited or restricted  Parke Poisson B 08/18/2014, 10:07 AM  Pager: [phone]

## 2014-08-18 NOTE — Progress Notes (Signed)
Discharge orders received. Pt for discharge home today. IV d/c'd. Pt given discharge instructions with verbalized understanding. Family in room to assist with discharge. Staff brought pt downstairs via wheelchair.   

## 2014-08-18 NOTE — Discharge Instructions (Signed)

## 2014-08-18 NOTE — Discharge Summary (Signed)
Physician Discharge Summary  Ronald Horton:[id number] DOB: 1935-05-21 DOA: 08/16/2014  PCP: No primary care provider on file.  Admit date: 08/16/2014 Discharge date: 08/18/2014  Time spent: 35 minutes  Recommendations for Outpatient Follow-up:  Patient will be discharged home. He is to follow-up with his primary care physician. If episodes of confusion began again, patient is urged to follow-up with neurology as an outpatient. Patient to continue his medications as prescribed. Patient may resume activity as tolerated. Patient instructed not to drive.   Discharge Diagnoses:  Acute encephalopathy Paroxysmal atrial fibrillation Fever  Discharge Condition: Stable   Diet recommendation: Heart healthy  Filed Weights   08/16/14 2233  Weight: 79.652 kg (175 lb 9.6 oz)    History of present illness:  on 08/16/2014 by Dr. Berle Mull Ronald Horton is a 79 y.o. male with Past medical history of paroxysmal atrial fibrillation on flecainide and Coumadin, mitral valve repair. The patient is presenting with complaints of forgetfulness. Today it was his birthday and he was planning to have a dinner with family and friends but when he woke up this morning he was confused as he was asking for dinner preparation in the morning.  Later on he and his wife drew to [place] and their friends felt that he was not acting coherent himself. There was no headache there was no fall no trauma no injury there was no fever no chills no chest pain. There was no blurring of vision there was no dropping of the angle of the mouth or speech difficulty and it was reported. At the time of my evaluation patient denies any fever, chills, headache, cough, chest pain, palpitation, shortness of breath, orthopnea, PND, nausea, vomiting, abdominal pain, diarrhea, constipation, active bleeding, burning urination, dizziness, pedal edema, focal neurological deficit. Later on his son mentions that since last to 3 month they  have been noticing occasional issues with memory but not to this extent that occurred today. Patient has family history was somewhat disease on his side.  Hospital Course:  Acute encephalopathy -Possibly secondary to TIA vs Seizure vs undiagnosed dementia -Resolved, patient back at baseline, unknown etiology -Neurology consulted and appreciated -EEG: Normal, awake, asleep with activation procedures, no focal lateralizing or epileptiform features -MRI brain: No acute abnormality and normal for age -UA negative for infection, no leukocytosis -Echocardiogram: EF is 81-19%, grade 1 diastolic dysfunction -Carotid Doppler: 1-39% ICA stenosis, vertebral artery flow is antegrade -PT consulted and patient does not need further therapy  Paroxysmal atrial fibrillation -Continue flecainide and Coumadin per pharmacy  Fever -Resolved, No infectious etiology found at this time, UA negative, no leukocytosis  Procedures: Echocardiogram EEG Carotid Doppler  Consultations: Neurology  Discharge Exam: Filed Vitals:   08/18/14 0558  BP: 113/73  Pulse: 77  Temp: 97.7 F (36.5 C)  Resp: 18     General: Well developed, well nourished, NAD, appears stated age  HEENT: NCAT, mucous membranes moist.  Cardiovascular: S1 S2 auscultated, irregular rhythm  Respiratory: Clear to auscultation bilaterally with equal chest rise  Abdomen: Soft, nontender, nondistended, + bowel sounds  Extremities: warm dry without cyanosis clubbing or edema  Neuro: AAOx3, no focal deficits  Psych: Appropriate mood and affect, intact judgment and insight  Discharge Instructions      Discharge Instructions    Discharge instructions    Complete by:  As directed   Patient will be discharged home. He is to follow-up with his primary care physician. If episodes of confusion began again, patient is urged to  follow-up with neurology as an outpatient. Patient to continue his medications as prescribed. Patient may  resume activity as tolerated. Patient instructed not to drive.            Medication List    TAKE these medications        acetaminophen 325 MG tablet  Commonly known as:  TYLENOL  Take 325-650 mg by mouth every 6 (six) hours as needed for mild pain.     allopurinol 100 MG tablet  Commonly known as:  ZYLOPRIM  Take 100 mg by mouth daily.     esomeprazole 20 MG capsule  Commonly known as:  NEXIUM  Take 20 mg by mouth daily as needed (heartburn).     flecainide 100 MG tablet  Commonly known as:  TAMBOCOR  Take 100 mg by mouth daily.     LOVAZA 1 G capsule  Generic drug:  omega-3 acid ethyl esters  Take 1 capsule by mouth 2 (two) times daily.     metoprolol 50 MG tablet  Commonly known as:  LOPRESSOR  Take 0.5 tablets (25 mg total) by mouth 2 (two) times daily.     warfarin 6 MG tablet  Commonly known as:  COUMADIN  Take 6-9 mg by mouth daily. Take 1.5 tablets (9 mg) on Tues, Fri, and Sun.  Take 1 tablet (6 mg) on all other days.       No Known Allergies Follow-up Information    Follow up with Primary care physician. Schedule an appointment as soon as possible for a visit in 1 week.   Why:  Hospital followup, confusion       The results of significant diagnostics from this hospitalization (including imaging, microbiology, ancillary and laboratory) are listed below for reference.    Significant Diagnostic Studies: Mri Brain Without Contrast  08/17/2014   CLINICAL DATA:  TIA. On Coumadin for atrial fibrillation. Prostate cancer. Confusion.  EXAM: MRI HEAD WITHOUT CONTRAST  TECHNIQUE: Multiplanar, multiecho pulse sequences of the brain and surrounding structures were obtained without intravenous contrast.  COMPARISON:  None.  FINDINGS: Negative for acute infarct.  Scattered small white matter hyperintensities bilaterally. Brainstem and cerebellum normal.  Mild atrophy.  Ventricles are prominent related to atrophy.  Negative for hemorrhage  Negative for mass or edema.   Pituitary normal in size.  Paranasal sinuses are clear.  IMPRESSION: No acute abnormality and normal for age.   Electronically Signed   By: Franchot Gallo M.D.   On: 08/17/2014 18:09    Microbiology: No results found for this or any previous visit (from the past 240 hour(s)).   Labs: Basic Metabolic Panel:  Recent Labs Lab 08/17/14 0005  NA 136  K 3.6  CL 105  CO2 23  GLUCOSE 112*  BUN 11  CREATININE 0.93  CALCIUM 8.8   Liver Function Tests:  Recent Labs Lab 08/17/14 0005  AST 25  ALT 21  ALKPHOS 43  BILITOT 0.9  PROT 6.4  ALBUMIN 3.5   No results for input(s): LIPASE, AMYLASE in the last 168 hours. No results for input(s): AMMONIA in the last 168 hours. CBC:  Recent Labs Lab 08/17/14 0005  WBC 9.9  HGB 14.7  HCT 44.8  MCV 92.2  PLT 137*   Cardiac Enzymes: No results for input(s): CKTOTAL, CKMB, CKMBINDEX, TROPONINI in the last 168 hours. BNP: BNP (last 3 results) No results for input(s): PROBNP in the last 8760 hours. CBG:  Recent Labs Lab 08/17/14 0644 08/17/14 1149 08/17/14 1656 08/17/14 2129  08/18/14 0627  GLUCAP 122* 114* 116* 104* 139*       Signed:  Cristal Ford  [facility] 08/18/2014, 10:03 AM

## 2014-08-18 NOTE — Progress Notes (Signed)
Subjective: Back to baseline per son.  No further complications over night.   Objective: Current vital signs: BP 113/73 mmHg  Pulse 77  Temp(Src) 97.7 F (36.5 C) (Oral)  Resp 18  Wt 79.652 kg (175 lb 9.6 oz)  SpO2 97% Vital signs in last 24 hours: Temp:  [97.7 F (36.5 C)-99.7 F (37.6 C)] 97.7 F (36.5 C) (12/31 0558) Pulse Rate:  [68-83] 77 (12/31 0558) Resp:  [18] 18 (12/31 0558) BP: (95-131)/(51-75) 113/73 mmHg (12/31 0558) SpO2:  [94 %-97 %] 97 % (12/31 0558)  Intake/Output from previous day: 12/30 0701 - 12/31 0700 In: 360 [P.O.:360] Out: -  Intake/Output this shift: Total I/O In: 200 [P.O.:200] Out: -  Nutritional status: Diet Heart  Neurologic Exam: General: NAD Mental Status: Alert, oriented, thought content appropriate.  Speech fluent without evidence of aphasia.  Able to follow 3 step commands without difficulty. Cranial Nerves: II:  Visual fields grossly normal, pupils equal, round, reactive to light and accommodation III,IV, VI: ptosis not present, extra-ocular motions intact bilaterally V,VII: smile symmetric, facial light touch sensation normal bilaterally VIII: hearing normal bilaterally IX,X: gag reflex present XI: bilateral shoulder shrug XII: midline tongue extension without atrophy or fasciculations  Motor: Right : Upper extremity   5/5    Left:     Upper extremity   5/5  Lower extremity   5/5     Lower extremity   5/5 Tone and bulk:normal tone throughout; no atrophy noted Sensory: Pinprick and light touch intact throughout, bilaterally Deep Tendon Reflexes:  1+ bilateral UE and no KJ or AJ  Plantars: Right: downgoing   Left: downgoing Cerebellar: normal finger-to-nose,  normal heel-to-shin test    Lab Results: Basic Metabolic Panel:  Recent Labs Lab 08/17/14 0005  NA 136  K 3.6  CL 105  CO2 23  GLUCOSE 112*  BUN 11  CREATININE 0.93  CALCIUM 8.8    Liver Function Tests:  Recent Labs Lab 08/17/14 0005  AST 25  ALT  21  ALKPHOS 43  BILITOT 0.9  PROT 6.4  ALBUMIN 3.5   No results for input(s): LIPASE, AMYLASE in the last 168 hours. No results for input(s): AMMONIA in the last 168 hours.  CBC:  Recent Labs Lab 08/17/14 0005  WBC 9.9  HGB 14.7  HCT 44.8  MCV 92.2  PLT 137*    Cardiac Enzymes: No results for input(s): CKTOTAL, CKMB, CKMBINDEX, TROPONINI in the last 168 hours.  Lipid Panel:  Recent Labs Lab 08/17/14 0005  CHOL 249*  TRIG 181*  HDL 49  CHOLHDL 5.1  VLDL 36  LDLCALC 164*    CBG:  Recent Labs Lab 08/17/14 0644 08/17/14 1149 08/17/14 1656 08/17/14 2129 08/18/14 0627  GLUCAP 122* 114* 116* 104* 139*    Microbiology: No results found for this or any previous visit.  Coagulation Studies:  Recent Labs  08/17/14 0005 08/18/14 0541  LABPROT 32.3* 32.7*  INR 3.12* 3.16*    Imaging: Mri Brain Without Contrast  08/17/2014   CLINICAL DATA:  TIA. On Coumadin for atrial fibrillation. Prostate cancer. Confusion.  EXAM: MRI HEAD WITHOUT CONTRAST  TECHNIQUE: Multiplanar, multiecho pulse sequences of the brain and surrounding structures were obtained without intravenous contrast.  COMPARISON:  None.  FINDINGS: Negative for acute infarct.  Scattered small white matter hyperintensities bilaterally. Brainstem and cerebellum normal.  Mild atrophy.  Ventricles are prominent related to atrophy.  Negative for hemorrhage  Negative for mass or edema.  Pituitary normal in size.  Paranasal sinuses  are clear.  IMPRESSION: No acute abnormality and normal for age.   Electronically Signed   By: Franchot Gallo M.D.   On: 08/17/2014 18:09   EEG: IMPRESSION: Normal electroencephalogram, awake, asleep and with activation procedures. There are no focal lateralizing or epileptiform features  VASCULAR LAB PRELIMINARY PRELIMINARY PRELIMINARY PRELIMINARY  Carotid duplex completed.   Preliminary report: Bilateral: 1-39% ICA stenosis. Vertebral artery flow is  antegrade  Echo: Impressions:  - Normal LV size with mild LV hypertrophy. EF 60-65%. Normal RV size and systolic function. Aortic sclerosis without significant stenosis. Status post mitral valve repair with no MR but evidence for moderate stenosis (MVA 1.57 cm^2, mean gradient 6 mmHg).     Medications:  Scheduled: . allopurinol  100 mg Oral Daily  . flecainide  100 mg Oral Daily  . metoprolol  25 mg Oral BID  . omega-3 acid ethyl esters  1 capsule Oral TID  . pantoprazole  40 mg Oral Daily  . Warfarin - Pharmacist Dosing Inpatient   Does not apply q1800   TSH, B12, Folate, Ammonia Pending --ordered by internal medicine.    Assessment/Plan: 79 YO male with transient AMS with confusion and word finding difficulty. Etiology remains unclear as all neurologic diagnostic testing was normal. At this time would not recommend any further in patient neurology diagnostic testing.  Recommend follow up with PCP as out patient and if these episodes continue would recommend out patient neurology follow up.   Neurology S/O     Etta Quill PA-C [facility] [phone]  08/18/2014, 9:12 AM

## 2014-08-21 LAB — FOLATE RBC: RBC Folate: 624 ng/mL — ABNORMAL HIGH (ref >280)

## 2014-11-11 ENCOUNTER — Telehealth: Payer: Self-pay | Admitting: Internal Medicine

## 2014-11-11 MED ORDER — METOPROLOL TARTRATE 50 MG PO TABS: ORAL_TABLET | ORAL | Status: DC

## 2014-11-11 NOTE — Telephone Encounter (Signed)
  Spoke with patient and let him know the half life of Flecainide is 6-8 hours, peaking at 4 hours, and he should notice feeling a difference hopefully by noon

## 2014-11-11 NOTE — Telephone Encounter (Signed)
Follow up      Pt is not feeling any better.  HR is now 96.  Please call

## 2014-11-11 NOTE — Telephone Encounter (Signed)
New message      Pt is in afib.  His HR was 100 at 7am.  He took 1/2 flecainide last night and 1 whole pill this am at 6:15.  HR is now 86-91.  Please advise

## 2014-11-11 NOTE — Telephone Encounter (Signed)
Patient reports recurrence of AFib that started last night at 10:15 pm with HR 115 in AFib - "banging away". Took 1/2 of Flecanide 100 mg tablet at 6:00 am. HR 104 AFib. At 8:00 am he took his regular dose of Flecanide 100 mg. HR 97 AFib. Denies SOB. Denies dizziness. Has just been resting today/no activity. Patient is concerned overall that his rate is up even though he said the "banging" in his chest has resolved. Remains asymptomatic (no SOB or dizziness). Discussed with L.Dorene Ar, NP. Patient's BP earlier was 109/76 but is currently 131/84 with HR 94. Order obtained from Cecilie Kicks, NP, to increase Metoprolol 50 mg tablet to following:  Take 1/2 tablet (25 mg dose) by mouth every 8 hours. Patient is to monitor BP and HR during the weekend and call back on Monday to get on APP schedule for Monday, March 28th if he is still having any problems or concerns. Patient also requested to schedule with Dr. Lovena Le as soon as possible, as he is "very concerned" with his condition now. Patient scheduled with Dr. Lovena Le for 11/22/14 at 3:15 pm to discuss recurrence of AFib and medication changes. Advised patient that should he experience any worsening of HR, BP, return of "chest banging" or any new symptoms, especially CP, SOB or dizziness over the weekend, to please call 911 and seek emergent care at the nearest Emergency Department. Patient verbalized understanding and agreement.

## 2014-11-14 NOTE — Telephone Encounter (Signed)
F/U        Pt calling, states he is not feeling well and would like to speak with nurse about irregular heartbeat.      Please return call.

## 2014-11-15 NOTE — Telephone Encounter (Signed)
Spoke with patient and his HR are avg low 100's and he is just not feeling well.

## 2014-11-15 NOTE — Telephone Encounter (Signed)
Follow up     Pt is returning a nurses call.  His HR now is 106 and he does not feel well.

## 2014-11-16 ENCOUNTER — Encounter: Payer: Self-pay | Admitting: Nurse Practitioner

## 2014-11-16 ENCOUNTER — Ambulatory Visit (INDEPENDENT_AMBULATORY_CARE_PROVIDER_SITE_OTHER): Payer: Medicare Other | Admitting: Nurse Practitioner

## 2014-11-16 ENCOUNTER — Encounter: Payer: Self-pay | Admitting: *Deleted

## 2014-11-16 VITALS — BP 110/82 | HR 96 | Ht 69.0 in | Wt 171.4 lb

## 2014-11-16 DIAGNOSIS — I480 Paroxysmal atrial fibrillation: Secondary | ICD-10-CM | POA: Diagnosis not present

## 2014-11-16 LAB — CBC WITH DIFFERENTIAL/PLATELET
Basophils Absolute: 0 10*3/uL (ref 0.0–0.1)
Basophils Relative: 0.3 % (ref 0.0–3.0)
EOS ABS: 0 10*3/uL (ref 0.0–0.7)
Eosinophils Relative: 0.6 % (ref 0.0–5.0)
HCT: 45.3 % (ref 39.0–52.0)
Hemoglobin: 15.4 g/dL (ref 13.0–17.0)
Lymphocytes Relative: 15.6 % (ref 12.0–46.0)
Lymphs Abs: 1.3 10*3/uL (ref 0.7–4.0)
MCHC: 34 g/dL (ref 30.0–36.0)
MCV: 89.1 fl (ref 78.0–100.0)
Monocytes Absolute: 0.8 10*3/uL (ref 0.1–1.0)
Monocytes Relative: 9.1 % (ref 3.0–12.0)
NEUTROS PCT: 74.4 % (ref 43.0–77.0)
Neutro Abs: 6.4 10*3/uL (ref 1.4–7.7)
PLATELETS: 173 10*3/uL (ref 150.0–400.0)
RBC: 5.09 Mil/uL (ref 4.22–5.81)
RDW: 13.7 % (ref 11.5–15.5)
WBC: 8.5 10*3/uL (ref 4.0–10.5)

## 2014-11-16 LAB — BASIC METABOLIC PANEL
BUN: 15 mg/dL (ref 6–23)
CALCIUM: 9.6 mg/dL (ref 8.4–10.5)
CO2: 35 mEq/L — ABNORMAL HIGH (ref 19–32)
Chloride: 103 mEq/L (ref 96–112)
Creatinine, Ser: 1.03 mg/dL (ref 0.40–1.50)
GFR: 74.38 mL/min (ref >60.00)
Glucose, Bld: 103 mg/dL — ABNORMAL HIGH (ref 70–99)
Potassium: 4.1 mEq/L (ref 3.5–5.1)
Sodium: 139 mEq/L (ref 135–145)

## 2014-11-16 NOTE — Patient Instructions (Signed)
Your physician recommends that you schedule a follow-up appointment in: 2 weeks from 4/1 with Dr. Lovena Le  Your physician recommends that you return for lab work in: today

## 2014-11-16 NOTE — Progress Notes (Signed)
Primary Care Physician: No primary care provider on file. 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 male with a h/o atrial fibrillation on flecainide, H/o MVR 1997 with moderate stenosis by echo 08/12/14,PVC's and possible TIA vrs dementia in December 2015. He was in his usual health until last Thursday when he developed fatigue and shortness of breath while cutting the bushes. He continued to have these symptoms over the next few days. He was seen by a cardiologist in the [place] area and was instructed to take 300 mg flecainide and to return the next day for EKG. If he did not convert, he would be scheduled for a cardioversion. He did  not feel comfortable with the advice, did not take flecainide and called here to be seen.  EKG today shows atrial fibrillation at 96 bpm with LAD and RBBB. He takes flecainide 100 mg 1x daily for some time. Taking metoprolol as directed. On warfarin, followed in [place]. Office there reports INR's 3/29-2.8, 3/9 1.7, 2/8-2.7. Does drink 2 large glasses of wine nightly and was advised to reduce alcohol intake to minimize afib burden. Denies snoring history.  Today, he denies symptoms of palpitations, chest pain, orthopnea, PND, lower extremity edema, dizziness, presyncope, syncope, or neurologic sequela. The patient is tolerating medications without difficulties and is otherwise without complaint today.   Past Medical History  Diagnosis Date  . Prostate cancer   . PAF (paroxysmal atrial fibrillation)   . History of inguinal hernia     bilateral  . Ventral hernia   . H/O mitral valve repair 1997    with ring  . PVC's (premature ventricular contractions)   . Fatigue   . Chronic anticoagulation    Past Surgical History  Procedure Laterality Date  . Mitral valve repair  1997  . Radioactive seed implant  2003  . Hernia repair      BIH    Current Outpatient Prescriptions  Medication Sig Dispense Refill  . acetaminophen (TYLENOL) 325 MG  tablet Take 325-650 mg by mouth every 6 (six) hours as needed for mild pain.    Marland Kitchen allopurinol (ZYLOPRIM) 100 MG tablet Take 100 mg by mouth daily.     Marland Kitchen esomeprazole (NEXIUM) 20 MG capsule Take 20 mg by mouth daily as needed (heartburn).     . flecainide (TAMBOCOR) 100 MG tablet Take 100 mg by mouth daily.    Marland Kitchen LOVAZA 1 G capsule Take 1 capsule by mouth 2 (two) times daily.     . metoprolol (LOPRESSOR) 50 MG tablet Take 0.5 tablets (25 mg total) by mouth 3 (three) times daily. 180 tablet 3  . warfarin (COUMADIN) 6 MG tablet Take 6-9 mg by mouth daily. Take 1.5 tablets (9 mg) on Tues, Fri, and Sun.  Take 1 tablet (6 mg) on all other days.     No current facility-administered medications for this visit.    No Known Allergies  History   Social History  . Marital Status: Married    Spouse Name: N/A  . Number of Children: N/A  . Years of Education: N/A   Occupational History  . Not on file.   Social History Main Topics  . Smoking status: Never Smoker   . Smokeless tobacco: Not on file  . Alcohol Use: Yes  . Drug Use: No  . Sexual Activity: Not on file   Other Topics Concern  . Not on file   Social History Narrative    Family History  Problem Relation  Age of Onset  . Cancer Father     prostate  . Heart disease Father   . Cancer Sister     colon    ROS- All systems are reviewed and negative except as per the HPI above  Physical Exam: Filed Vitals:   11/16/14 1440  BP: 110/82  Pulse: 96  Height: 5\' 9"  (1.753 m)  Weight: 171 lb 6.4 oz (77.747 kg)    GEN- The patient is well appearing, alert and oriented x 3 today.   Head- normocephalic, atraumatic Eyes-  Sclera clear, conjunctiva pink Ears- hearing intact Oropharynx- clear Neck- supple, no JVP Lymph- no cervical lymphadenopathy Lungs- Clear to ausculation bilaterally, normal work of breathing Heart- Irregular rate and rhythm, no murmurs, rubs or gallops, PMI not laterally displaced GI- soft, NT, ND, +  BS Extremities- no clubbing, cyanosis, or edema MS- no significant deformity or atrophy Skin- no rash or lesion Psych- euthymic mood, full affect Neuro- strength and sensation are intact  EKG- afib at 96 bpm, LAD,RBBB, QRS 122 ms, QTc 48ms.  Echo-08/17/14- Normal LV size with mild LV hypertrophy. EF 60-65%. Normal RV size and systolic function. Aortic sclerosis without significant stenosis. Status post mitral valve repair with no MR but evidence for moderate stenosis (MVA 1.57 cm^2, mean gradient 6 mmHg).  Epic records reviewed  LAST 3 INR's(followed in [place])-  3/29-2.8, 3/9 1.7, 2/8-2.7.  Assessment and Plan:  1. Symptomatic Persistent afib on flecainide Scheduled for DCCV on Friday. Will need TEE due to sub therapeutic INR 3/9 of 1.7, h/o mitral value stenosis with h/o possible TIA in December 2015.  2.Decrease alcohol consumption  3. F/u with Dr. Lovena Le as scheduled 11/22/14.

## 2014-11-16 NOTE — Telephone Encounter (Signed)
Spoke with patient and offered him an appointment with Roderic Palau, NP.  He was so thankful and will be here at 2:30pm.  Butch Penny has his EKG from Cardiologist he went to in [place].  He says he just does not feel comfortable with what they had suggested and will be at appointment given.  They had asked he take Flecainide 300mg  and come back the next day for an EKG if he had not converted they were going to set him up for a DCCV

## 2014-11-17 NOTE — Anesthesia Preprocedure Evaluation (Addendum)
Anesthesia Evaluation  Patient identified by MRN, date of birth, ID band Patient awake    Reviewed: Allergy & Precautions, NPO status , Patient's Chart, lab work & pertinent test results  History of Anesthesia Complications Negative for: history of anesthetic complications  Airway Mallampati: II  TM Distance: >3 FB Neck ROM: Full    Dental no notable dental hx. (+) Dental Advisory Given   Pulmonary neg pulmonary ROS,  breath sounds clear to auscultation  Pulmonary exam normal       Cardiovascular + dysrhythmias Atrial Fibrillation + Valvular Problems/Murmurs (moderate mitral stenosis s/p MVR in 90s) Rhythm:Regular Rate:Normal     Neuro/Psych TIAnegative neurological ROS  negative psych ROS   GI/Hepatic negative GI ROS, Neg liver ROS,   Endo/Other  negative endocrine ROS  Renal/GU negative Renal ROS  negative genitourinary   Musculoskeletal negative musculoskeletal ROS (+)   Abdominal   Peds negative pediatric ROS (+)  Hematology negative hematology ROS (+)   Anesthesia Other Findings   Reproductive/Obstetrics negative OB ROS                            Anesthesia Physical Anesthesia Plan  ASA: III  Anesthesia Plan: MAC   Post-op Pain Management:    Induction: Intravenous  Airway Management Planned: Mask  Additional Equipment:   Intra-op Plan:   Post-operative Plan:   Informed Consent: I have reviewed the patients History and Physical, chart, labs and discussed the procedure including the risks, benefits and alternatives for the proposed anesthesia with the patient or authorized representative who has indicated his/her understanding and acceptance.   Dental advisory given  Plan Discussed with: CRNA and Surgeon  Anesthesia Plan Comments:        Anesthesia Quick Evaluation

## 2014-11-18 ENCOUNTER — Encounter (HOSPITAL_COMMUNITY): Payer: Self-pay | Admitting: *Deleted

## 2014-11-18 ENCOUNTER — Encounter (HOSPITAL_COMMUNITY)
Admission: RE | Disposition: A | Discharge status: Home / Self Care | Payer: Self-pay | Source: Ambulatory Visit | Attending: Cardiology

## 2014-11-18 ENCOUNTER — Ambulatory Visit (HOSPITAL_COMMUNITY): Payer: Medicare Other | Admitting: Anesthesiology

## 2014-11-18 ENCOUNTER — Ambulatory Visit (HOSPITAL_COMMUNITY)
Admission: RE | Admit: 2014-11-18 | Discharge: 2014-11-18 | Disposition: A | Discharge status: Home / Self Care | Payer: Medicare Other | Source: Ambulatory Visit | Attending: Cardiology | Admitting: Cardiology

## 2014-11-18 DIAGNOSIS — Z8249 Family history of ischemic heart disease and other diseases of the circulatory system: Secondary | ICD-10-CM | POA: Insufficient documentation

## 2014-11-18 DIAGNOSIS — I4891 Unspecified atrial fibrillation: Secondary | ICD-10-CM

## 2014-11-18 DIAGNOSIS — Z7901 Long term (current) use of anticoagulants: Secondary | ICD-10-CM | POA: Insufficient documentation

## 2014-11-18 DIAGNOSIS — Z8546 Personal history of malignant neoplasm of prostate: Secondary | ICD-10-CM | POA: Insufficient documentation

## 2014-11-18 DIAGNOSIS — I081 Rheumatic disorders of both mitral and tricuspid valves: Secondary | ICD-10-CM | POA: Insufficient documentation

## 2014-11-18 DIAGNOSIS — I481 Persistent atrial fibrillation: Secondary | ICD-10-CM | POA: Diagnosis not present

## 2014-11-18 DIAGNOSIS — I342 Nonrheumatic mitral (valve) stenosis: Secondary | ICD-10-CM

## 2014-11-18 HISTORY — PX: TEE WITHOUT CARDIOVERSION: SHX5443

## 2014-11-18 HISTORY — PX: CARDIOVERSION: SHX1299

## 2014-11-18 LAB — PROTIME-INR
INR: 2.07 — ABNORMAL HIGH (ref 0.00–1.49)
PROTHROMBIN TIME: 23.5 s — AB (ref 11.6–15.2)

## 2014-11-18 SURGERY — ECHOCARDIOGRAM, TRANSESOPHAGEAL
Anesthesia: Monitor Anesthesia Care

## 2014-11-18 MED ORDER — FENTANYL CITRATE 0.05 MG/ML IJ SOLN
INTRAMUSCULAR | Status: DC | PRN
  Administered 2014-11-18: 50 ug via INTRAVENOUS

## 2014-11-18 MED ORDER — BUTAMBEN-TETRACAINE-BENZOCAINE 2-2-14 % EX AERO
INHALATION_SPRAY | CUTANEOUS | Status: DC | PRN
  Administered 2014-11-18: 2 via TOPICAL

## 2014-11-18 MED ORDER — LACTATED RINGERS IV SOLN
INTRAVENOUS | Status: DC | PRN
  Administered 2014-11-18: 09:00:00 via INTRAVENOUS

## 2014-11-18 MED ORDER — LACTATED RINGERS IV SOLN
INTRAVENOUS | Status: DC
  Administered 2014-11-18: 08:00:00 via INTRAVENOUS

## 2014-11-18 MED ORDER — SODIUM CHLORIDE 0.9 % IV SOLN: INTRAVENOUS | Status: DC

## 2014-11-18 MED ORDER — PROPOFOL 10 MG/ML IV BOLUS
INTRAVENOUS | Status: DC | PRN
  Administered 2014-11-18: 20 mg via INTRAVENOUS
  Administered 2014-11-18: 30 mg via INTRAVENOUS
  Administered 2014-11-18: 50 mg via INTRAVENOUS

## 2014-11-18 NOTE — Transfer of Care (Signed)
Immediate Anesthesia Transfer of Care Note  Patient: Ronald Horton  Procedure(s) Performed: Procedure(s): TRANSESOPHAGEAL ECHOCARDIOGRAM (TEE) (N/A) CARDIOVERSION (N/A)  Patient Location: PACU and Endoscopy Unit  Anesthesia Type:MAC  Level of Consciousness: awake, alert , oriented and sedated  Airway & Oxygen Therapy: Patient Spontanous Breathing and Patient connected to nasal cannula oxygen  Post-op Assessment: Report given to RN, Post -op Vital signs reviewed and stable and Patient moving all extremities  Post vital signs: Reviewed and stable  Last Vitals:  Filed Vitals:   11/18/14 0748  BP: 120/86  Pulse: 100  Temp: 36.4 C  Resp: 23    Complications: No apparent anesthesia complications

## 2014-11-18 NOTE — Discharge Instructions (Addendum)

## 2014-11-18 NOTE — Progress Notes (Signed)
  Echocardiogram Echocardiogram Transesophageal has been performed.  Bobbye Charleston 11/18/2014, 9:24 AM

## 2014-11-18 NOTE — CV Procedure (Addendum)
See full TEE report in [employer]; patient sedated by anesthesia with diprovan 100 mg IV total; TEE revealed normal LV function; no LAA thrombus; pt subsequently had DCCV with 120 J to sinus bradycardia; no immediate complications; continue coumadin. Kirk Ruths

## 2014-11-18 NOTE — H&P (Signed)
[place]  11/16/2014 2:30 PM  Office Visit  MRN:  [id number]   Description: Male DOB: 27-Sep-1934  Provider: Sherran Needs, NP  Department: [facility] [address] [facility]       Vital Signs  Most recent update: 11/16/2014 2:44 PM by Juluis Mire, RN    BP Pulse Ht Wt BMI    110/82 mmHg 96 5\' 9"  (1.753 m) 171 lb 6.4 oz (77.747 kg) 25.30 kg/m2      Progress Notes      Sherran Needs, NP at 11/16/2014 4:32 PM     Status: Signed       Expand All Collapse All    Primary Care Physician: No primary care provider on file. 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 male with a h/o atrial fibrillation on flecainide, H/o MVR 1997 with moderate stenosis by echo 08/12/14,PVC's and possible TIA vrs dementia in December 2015. He was in his usual health until last Thursday when he developed fatigue and shortness of breath while cutting the bushes. He continued to have these symptoms over the next few days. He was seen by a cardiologist in the [place] area and was instructed to take 300 mg flecainide and to return the next day for EKG. If he did not convert, he would be scheduled for a cardioversion. He did not feel comfortable with the advice, did not take flecainide and called here to be seen.  EKG today shows atrial fibrillation at 96 bpm with LAD and RBBB. He takes flecainide 100 mg 1x daily for some time. Taking metoprolol as directed. On warfarin, followed in [place]. Office there reports INR's 3/29-2.8, 3/9 1.7, 2/8-2.7. Does drink 2 large glasses of wine nightly and was advised to reduce alcohol intake to minimize afib burden. Denies snoring history.  Today, he denies symptoms of palpitations, chest pain, orthopnea, PND, lower extremity edema, dizziness, presyncope, syncope, or neurologic sequela. The patient is tolerating medications without difficulties and is otherwise without complaint today.   Past Medical History  Diagnosis Date  . Prostate cancer   . PAF  (paroxysmal atrial fibrillation)   . History of inguinal hernia     bilateral  . Ventral hernia   . H/O mitral valve repair 1997    with ring  . PVC's (premature ventricular contractions)   . Fatigue   . Chronic anticoagulation    Past Surgical History  Procedure Laterality Date  . Mitral valve repair  1997  . Radioactive seed implant  2003  . Hernia repair      BIH    Current Outpatient Prescriptions  Medication Sig Dispense Refill  . acetaminophen (TYLENOL) 325 MG tablet Take 325-650 mg by mouth every 6 (six) hours as needed for mild pain.    Marland Kitchen allopurinol (ZYLOPRIM) 100 MG tablet Take 100 mg by mouth daily.     Marland Kitchen esomeprazole (NEXIUM) 20 MG capsule Take 20 mg by mouth daily as needed (heartburn).     . flecainide (TAMBOCOR) 100 MG tablet Take 100 mg by mouth daily.    Marland Kitchen LOVAZA 1 G capsule Take 1 capsule by mouth 2 (two) times daily.     . metoprolol (LOPRESSOR) 50 MG tablet Take 0.5 tablets (25 mg total) by mouth 3 (three) times daily. 180 tablet 3  . warfarin (COUMADIN) 6 MG tablet Take 6-9 mg by mouth daily. Take 1.5 tablets (9 mg) on Tues, Fri, and Sun. Take 1 tablet (6 mg) on all other days.  No current facility-administered medications for this visit.    No Known Allergies  History   Social History  . Marital Status: Married    Spouse Name: N/A  . Number of Children: N/A  . Years of Education: N/A   Occupational History  . Not on file.   Social History Main Topics  . Smoking status: Never Smoker   . Smokeless tobacco: Not on file  . Alcohol Use: Yes  . Drug Use: No  . Sexual Activity: Not on file   Other Topics Concern  . Not on file   Social History Narrative    Family History  Problem Relation Age of Onset  . Cancer Father     prostate  . Heart disease Father   . Cancer Sister      colon    ROS- All systems are reviewed and negative except as per the HPI above  Physical Exam: Filed Vitals:   11/16/14 1440  BP: 110/82  Pulse: 96  Height: 5\' 9"  (1.753 m)  Weight: 171 lb 6.4 oz (77.747 kg)    GEN- The patient is well appearing, alert and oriented x 3 today.  Head- normocephalic, atraumatic Eyes- Sclera clear, conjunctiva pink Ears- hearing intact Oropharynx- clear Neck- supple, no JVP Lymph- no cervical lymphadenopathy Lungs- Clear to ausculation bilaterally, normal work of breathing Heart- Irregular rate and rhythm, no murmurs, rubs or gallops, PMI not laterally displaced GI- soft, NT, ND, + BS Extremities- no clubbing, cyanosis, or edema MS- no significant deformity or atrophy Skin- no rash or lesion Psych- euthymic mood, full affect Neuro- strength and sensation are intact  EKG- afib at 96 bpm, LAD,RBBB, QRS 122 ms, QTc 413ms.  Echo-08/17/14- Normal LV size with mild LV hypertrophy. EF 60-65%. Normal RV size and systolic function. Aortic sclerosis without significant stenosis. Status post mitral valve repair with no MR but evidence for moderate stenosis (MVA 1.57 cm^2, mean gradient 6 mmHg).  Epic records reviewed  LAST 3 INR's(followed in [place])- 3/29-2.8, 3/9 1.7, 2/8-2.7.  Assessment and Plan:  1. Symptomatic Persistent afib on flecainide Scheduled for DCCV on Friday. Will need TEE due to sub therapeutic INR 3/9 of 1.7, h/o mitral value stenosis with h/o possible TIA in December 2015.  2.Decrease alcohol consumption  3. F/u with Dr. Lovena Le as scheduled 11/22/14.       For TEE/DCCV; no changes. Kirk Ruths

## 2014-11-18 NOTE — Anesthesia Postprocedure Evaluation (Signed)
  Anesthesia Post-op Note  Patient: Ronald Horton  Procedure(s) Performed: Procedure(s) (LRB): TRANSESOPHAGEAL ECHOCARDIOGRAM (TEE) (N/A) CARDIOVERSION (N/A)  Patient Location: PACU  Anesthesia Type: MAC  Level of Consciousness: awake and alert   Airway and Oxygen Therapy: Patient Spontanous Breathing  Post-op Pain: mild  Post-op Assessment: Post-op Vital signs reviewed, Patient's Cardiovascular Status Stable, Respiratory Function Stable, Patent Airway and No signs of Nausea or vomiting  Last Vitals:  Filed Vitals:   11/18/14 0950  BP: 126/56  Pulse: 57  Temp:   Resp: 14    Post-op Vital Signs: stable   Complications: No apparent anesthesia complications

## 2014-11-21 ENCOUNTER — Encounter (HOSPITAL_COMMUNITY): Payer: Self-pay | Admitting: Cardiology

## 2014-11-22 ENCOUNTER — Ambulatory Visit (INDEPENDENT_AMBULATORY_CARE_PROVIDER_SITE_OTHER): Payer: Medicare Other | Admitting: Internal Medicine

## 2014-11-22 ENCOUNTER — Encounter: Payer: Self-pay | Admitting: Internal Medicine

## 2014-11-22 VITALS — BP 120/80 | HR 54 | Ht 69.5 in | Wt 172.6 lb

## 2014-11-22 DIAGNOSIS — R0600 Dyspnea, unspecified: Secondary | ICD-10-CM

## 2014-11-22 DIAGNOSIS — G454 Transient global amnesia: Secondary | ICD-10-CM | POA: Diagnosis not present

## 2014-11-22 DIAGNOSIS — I4891 Unspecified atrial fibrillation: Secondary | ICD-10-CM | POA: Diagnosis not present

## 2014-11-22 MED ORDER — FLECAINIDE ACETATE 100 MG PO TABS: 100.0000 mg | ORAL_TABLET | Freq: Two times a day (BID) | ORAL | Status: DC

## 2014-11-22 NOTE — Assessment & Plan Note (Signed)
His symptoms are class IIa. He will continue his current medications.

## 2014-11-22 NOTE — Assessment & Plan Note (Signed)
He has had recurrent symptoms, which were sustained, and for which he ultimately underwent cardioversion. He had been taking flecainide 100 mg daily, and I have increased his dose to 100 mg twice daily. In the interim, he will return for an EKG and a trough flecainide level in approximately 2 weeks.

## 2014-11-22 NOTE — Progress Notes (Signed)
HPI Mr. Burby returns today for followup. He is a pleasant 80 year old man with a history of symptomatic PVCs and paroxysmal atrial arrhythmias. He takes flecainide 100 mg daily along with low-dose metoprolol. He has had no sustained arrhythmias and essentially no palpitations on this regimen, until approximately one week ago when he went out of rhythm, which persisted, and ultimately resulted in his need to undergo cardioversion which was carried out successfully several days ago. He denies chest pain or shortness of breath. No syncope.  No Known Allergies   Current Outpatient Prescriptions  Medication Sig Dispense Refill  . acetaminophen (TYLENOL) 325 MG tablet Take 325-650 mg by mouth every 6 (six) hours as needed for mild pain.    Marland Kitchen allopurinol (ZYLOPRIM) 100 MG tablet Take 100 mg by mouth daily.     Marland Kitchen esomeprazole (NEXIUM) 20 MG capsule Take 20 mg by mouth daily as needed (for heartburn).     . flecainide (TAMBOCOR) 100 MG tablet Take 1 tablet (100 mg total) by mouth 2 (two) times daily. 60 tablet 11  . metoprolol (LOPRESSOR) 50 MG tablet Take 0.5 tablets (25 mg total) by mouth 3 (three) times daily. 180 tablet 3  . omega-3 acid ethyl esters (LOVAZA) 1 G capsule Take 1 g by mouth 2 (two) times daily.    Marland Kitchen warfarin (COUMADIN) 6 MG tablet Take 6-9 mg by mouth daily. Take 1.5 tablets (9 mg) on Tues, Fri, and Sun.  Take 1 tablet (6 mg) on all other days.     No current facility-administered medications for this visit.     Past Medical History  Diagnosis Date  . Prostate cancer   . PAF (paroxysmal atrial fibrillation)   . History of inguinal hernia     bilateral  . Ventral hernia   . H/O mitral valve repair 1997    with ring  . PVC's (premature ventricular contractions)   . Fatigue   . Chronic anticoagulation     ROS:   All systems reviewed and negative except as noted in the HPI.   Past Surgical History  Procedure Laterality Date  . Mitral valve repair  1997  . Radioactive  seed implant  2003  . Hernia repair      BIH  . Tee without cardioversion N/A 11/18/2014    Procedure: TRANSESOPHAGEAL ECHOCARDIOGRAM (TEE);  Surgeon: Lelon Perla, MD;  Location: [facility] ENDOSCOPY;  Service: Cardiovascular;  Laterality: N/A;  . Cardioversion N/A 11/18/2014    Procedure: CARDIOVERSION;  Surgeon: Lelon Perla, MD;  Location: [facility] ENDOSCOPY;  Service: Cardiovascular;  Laterality: N/A;     Family History  Problem Relation Age of Onset  . Cancer Father     prostate  . Heart disease Father   . Cancer Sister     colon     History   Social History  . Marital Status: Married    Spouse Name: N/A  . Number of Children: N/A  . Years of Education: N/A   Occupational History  . Not on file.   Social History Main Topics  . Smoking status: Never Smoker   . Smokeless tobacco: Not on file  . Alcohol Use: Yes  . Drug Use: No  . Sexual Activity: Not on file   Other Topics Concern  . Not on file   Social History Narrative     BP 120/80 mmHg  Pulse 54  Ht 5' 9.5" (1.765 m)  Wt 172 lb 9.6 oz (78.291 kg)  BMI 25.13 kg/m2  Physical  Exam:  Well appearing 80 yo man, NAD HEENT: Unremarkable Neck:  No JVD, no thyromegally Back:  No CVA tenderness Lungs:  Clear with no wheezes HEART:  Regular rate rhythm, no murmurs, no rubs, no clicks Abd:  soft, positive bowel sounds, no organomegally, no rebound, no guarding Ext:  2 plus pulses, no edema, no cyanosis, no clubbing Skin:  No rashes no nodules Neuro:  CN II through XII intact, motor grossly intact   EKG sinus bradycardia with left axis deviation and incomplete right bundle branch block   Assess/Plan:

## 2014-11-22 NOTE — Assessment & Plan Note (Signed)
He will continue systemic anticoagulation.

## 2014-11-22 NOTE — Patient Instructions (Signed)
Your physician has recommended you make the following change in your medication:  1.) INCREASE FLECAINIDE TO 100 MG TWICE DAILY  Your physician recommends that you schedule a follow-up appointment in: 2 WEEKS FOR EKG AND LAB (FLECAINIDE LEVEL)  Your physician recommends that you return for lab work in: 2 WEEKS FOR LAB WORK--FLECAINIDE LEVEL  ^^DO NOT TAKE YOUR MORNING DOSE OF FLECAINIDE ON THE DAY YOU COME FOR BLOOD [facility]

## 2014-12-07 ENCOUNTER — Telehealth: Payer: Self-pay | Admitting: Internal Medicine

## 2014-12-07 ENCOUNTER — Other Ambulatory Visit (INDEPENDENT_AMBULATORY_CARE_PROVIDER_SITE_OTHER): Payer: Medicare Other | Admitting: *Deleted

## 2014-12-07 ENCOUNTER — Encounter: Payer: Self-pay | Admitting: Internal Medicine

## 2014-12-07 ENCOUNTER — Ambulatory Visit (INDEPENDENT_AMBULATORY_CARE_PROVIDER_SITE_OTHER): Payer: Medicare Other | Admitting: *Deleted

## 2014-12-07 VITALS — HR 44 | Wt 169.0 lb

## 2014-12-07 DIAGNOSIS — I4891 Unspecified atrial fibrillation: Secondary | ICD-10-CM

## 2014-12-07 DIAGNOSIS — I493 Ventricular premature depolarization: Secondary | ICD-10-CM | POA: Diagnosis not present

## 2014-12-07 MED ORDER — METOPROLOL TARTRATE 25 MG PO TABS: 25.0000 mg | ORAL_TABLET | Freq: Two times a day (BID) | ORAL | Status: DC

## 2014-12-07 NOTE — Telephone Encounter (Signed)
New Message       Pt calling stating that he was told not to take his Flecainide this morning before his appt for his ekg but was not told if he is supposed to double up tonight or if skipping that dosage this morning is ok. Please call back and advise.

## 2014-12-07 NOTE — Telephone Encounter (Signed)
Spoke to patient and reminded him that we did discuss this during the nurse visit this morning. Patient is to take his evening dose of Flecainide but only one dose - Do Not Double up on dose tonight. Patient repeated instructions to take his evening dose and then tomorrow to restart his normal dosing schedule, as usual. Patient verbalized understanding and agreement.

## 2014-12-07 NOTE — Progress Notes (Signed)
Patient in today for EKG and Flecainide level to be drawn. Labs completed. EKG completed and reviewed by Dr. Rayann Heman. EKG showed sinus bradycardia with sinus arrhythmia with 1st degree AV block.  HR 44. Denies chest pain or other changes/sx. BP at home ranges from 100-120's/60's. Patient weight today 169 lbs. Changes were ordered as follows:  Decrease Metoprolol from 25 mg three times daily to Metoprolol 25 mg twice daily.  Patient verbalized understanding and agreement with treatment care plan.

## 2014-12-07 NOTE — Addendum Note (Signed)
Addended by: Eulis Foster on: 12/07/2014 08:46 AM   Modules accepted: Orders

## 2014-12-07 NOTE — Telephone Encounter (Signed)
New Message        Pt calling stating that FLECAINIDE

## 2014-12-13 ENCOUNTER — Telehealth: Payer: Self-pay | Admitting: *Deleted

## 2014-12-13 LAB — FLECAINIDE LEVEL: FLECAINIDE: 0.55 ug/mL (ref 0.20–1.00)

## 2014-12-13 NOTE — Telephone Encounter (Signed)
Spoke with patient and he thinks he is doing good.  Says he is feeling good.  His HR is averaging around 58 last few days.  He does have mild SOB with activity.  BP 120/70  He will call if any problems

## 2015-10-30 ENCOUNTER — Other Ambulatory Visit: Payer: Self-pay | Admitting: Internal Medicine

## 2015-10-31 ENCOUNTER — Other Ambulatory Visit: Payer: Self-pay

## 2015-10-31 MED ORDER — METOPROLOL TARTRATE 25 MG PO TABS: 25.0000 mg | ORAL_TABLET | Freq: Two times a day (BID) | ORAL | Status: DC

## 2016-02-06 ENCOUNTER — Encounter: Payer: Self-pay | Admitting: Internal Medicine

## 2016-02-06 ENCOUNTER — Ambulatory Visit (INDEPENDENT_AMBULATORY_CARE_PROVIDER_SITE_OTHER): Payer: Medicare Other | Admitting: Internal Medicine

## 2016-02-06 VITALS — BP 120/68 | HR 61 | Ht 69.5 in | Wt 157.0 lb

## 2016-02-06 DIAGNOSIS — I493 Ventricular premature depolarization: Secondary | ICD-10-CM

## 2016-02-06 DIAGNOSIS — R079 Chest pain, unspecified: Secondary | ICD-10-CM

## 2016-02-06 NOTE — Patient Instructions (Signed)

## 2016-02-06 NOTE — Progress Notes (Signed)
HPI [name] returns today for followup. He is a pleasant 81 year old man with a history of symptomatic PVCs and paroxysmal atrial arrhythmias. He takes flecainide 100 mg daily along with low-dose metoprolol. He has had no sustained arrhythmias and essentially no palpitations. He denies chest pain or shortness of breath. No syncope. About 2 weeks ago he was bitten by a tick and a week ago he developed altered mental status and difficulty with his speech. He was taken to [facility] where he was diagnosed with Erhlichiosis. He is better but still not back to normal despite a course of doxycycline.  No Known Allergies   Current Outpatient Prescriptions  Medication Sig Dispense Refill  . acetaminophen (TYLENOL) 325 MG tablet Take 325-650 mg by mouth every 6 (six) hours as needed for mild pain.    Marland Kitchen allopurinol (ZYLOPRIM) 100 MG tablet Take 100 mg by mouth daily.     Marland Kitchen esomeprazole (NEXIUM) 20 MG capsule Take 20 mg by mouth daily as needed (for heartburn).     . flecainide (TAMBOCOR) 100 MG tablet TAKE ONE TABLET BY MOUTH 2 TIMES A DAY 60 tablet 1  . metoprolol tartrate (LOPRESSOR) 25 MG tablet Take 1 tablet (25 mg total) by mouth 2 (two) times daily. 180 tablet 0  . omega-3 acid ethyl esters (LOVAZA) 1 G capsule Take 1 g by mouth 2 (two) times daily.    Marland Kitchen warfarin (COUMADIN) 6 MG tablet Take 6-9 mg by mouth daily. Take 1.5 tablets (9 mg) on Tues, Fri, and Sun.  Take 1 tablet (6 mg) on all other days.     No current facility-administered medications for this visit.     Past Medical History  Diagnosis Date  . Prostate cancer ([place])   . PAF (paroxysmal atrial fibrillation) ([place])   . History of inguinal hernia     bilateral  . Ventral hernia   . H/O mitral valve repair 1997    with ring  . PVC's (premature ventricular contractions)   . Fatigue   . Chronic anticoagulation     ROS:   All systems reviewed and negative except as noted in the HPI.   Past Surgical History  Procedure  Laterality Date  . Mitral valve repair  1997  . Radioactive seed implant  2003  . Hernia repair      BIH  . Tee without cardioversion N/A 11/18/2014    Procedure: TRANSESOPHAGEAL ECHOCARDIOGRAM (TEE);  Surgeon: Lelon Perla, MD;  Location: [facility] ENDOSCOPY;  Service: Cardiovascular;  Laterality: N/A;  . Cardioversion N/A 11/18/2014    Procedure: CARDIOVERSION;  Surgeon: Lelon Perla, MD;  Location: [facility] ENDOSCOPY;  Service: Cardiovascular;  Laterality: N/A;     Family History  Problem Relation Age of Onset  . Cancer Father     prostate  . Heart disease Father   . Cancer Sister     colon     Social History   Social History  . Marital Status: Married    Spouse Name: N/A  . Number of Children: N/A  . Years of Education: N/A   Occupational History  . Not on file.   Social History Main Topics  . Smoking status: Never Smoker   . Smokeless tobacco: Not on file  . Alcohol Use: Yes  . Drug Use: No  . Sexual Activity: Not on file   Other Topics Concern  . Not on file   Social History Narrative     BP 120/68 mmHg  Pulse 61  Ht 5'  9.5" (1.765 m)  Wt 157 lb (71.215 kg)  BMI 22.86 kg/m2  Physical Exam:  Well appearing 81 yo man, NAD HEENT: Unremarkable Neck:  6 cm JVD, no thyromegally Back:  No CVA tenderness Lungs:  Clear with no wheezes HEART:  Regular rate rhythm, no murmurs, no rubs, no clicks Abd:  soft, positive bowel sounds, no organomegally, no rebound, no guarding Ext:  2 plus pulses, no edema, no cyanosis, no clubbing Skin:  No rashes no nodules Neuro:  CN II through XII intact, motor grossly intact   EKG - NSR with IVCD and marked first degree AV block, left axis   Assess/Plan: 1. PVC's - he remains asymptomatic on flecainide. Continue. 2. PAF -he is maintaining NSR on Flecainide. 3. Erhlichiosis - he is improving. He has completed a course of anti-biotics. Will follow. 4. Heart block - he is asymptomatic. I warned him that he may developed  worsening conduction system disease. He is currently asymptomatic.  Mikle Bosworth.D.

## 2016-03-09 ENCOUNTER — Other Ambulatory Visit: Payer: Self-pay | Admitting: Internal Medicine

## 2016-05-15 ENCOUNTER — Other Ambulatory Visit: Payer: Self-pay | Admitting: Internal Medicine

## 2016-05-22 NOTE — Addendum Note (Signed)
Addended by: Briant Cedar R on: 05/22/2016 04:48 PM   Modules accepted: Orders

## 2016-05-23 ENCOUNTER — Telehealth: Payer: Self-pay | Admitting: Internal Medicine

## 2016-05-23 NOTE — Telephone Encounter (Signed)
Ronald Horton is going to call and schedule patient for Myoview and follow up with Dr Lovena Le same day.  Patient aware.

## 2016-05-23 NOTE — Telephone Encounter (Signed)
New Message  Pt call requesting to speak with RN. Pt states in his office visit with Dr. Cristela Felt he told pt he was experiencing Afib at the time of the appt. Pt states he wasn't feeling any pain. Pt states he was instructed to call and make an appt with cardiologist as soon as possible. Pt would like to speak with RN about his Afib. Please call back to discuss

## 2016-05-29 ENCOUNTER — Ambulatory Visit (HOSPITAL_COMMUNITY)
Admission: RE | Admit: 2016-05-29 | Discharge: 2016-05-29 | Disposition: A | Discharge status: Home / Self Care | Payer: Medicare Other | Source: Ambulatory Visit | Attending: Cardiovascular Disease | Admitting: Cardiovascular Disease

## 2016-05-29 DIAGNOSIS — R42 Dizziness and giddiness: Secondary | ICD-10-CM | POA: Diagnosis not present

## 2016-05-29 DIAGNOSIS — Z8249 Family history of ischemic heart disease and other diseases of the circulatory system: Secondary | ICD-10-CM | POA: Insufficient documentation

## 2016-05-29 DIAGNOSIS — R5383 Other fatigue: Secondary | ICD-10-CM | POA: Diagnosis not present

## 2016-05-29 DIAGNOSIS — R079 Chest pain, unspecified: Secondary | ICD-10-CM | POA: Insufficient documentation

## 2016-05-29 DIAGNOSIS — R0609 Other forms of dyspnea: Secondary | ICD-10-CM | POA: Diagnosis not present

## 2016-05-29 LAB — MYOCARDIAL PERFUSION IMAGING
CHL CUP NUCLEAR SRS: 1
CHL CUP NUCLEAR SSS: 3
LV dias vol: 80 mL (ref 62–150)
LVSYSVOL: 44 mL
Peak HR: 84 {beats}/min
Rest HR: 78 {beats}/min
SDS: 2
TID: 1.06

## 2016-05-29 MED ORDER — TECHNETIUM TC 99M TETROFOSMIN IV KIT
32.1000 | PACK | Freq: Once | INTRAVENOUS | Status: AC | PRN
  Administered 2016-05-29: 32.1 via INTRAVENOUS
  Filled 2016-05-29: qty 33

## 2016-05-29 MED ORDER — AMINOPHYLLINE 25 MG/ML IV SOLN
75.0000 mg | Freq: Once | INTRAVENOUS | Status: AC
  Administered 2016-05-29: 75 mg via INTRAVENOUS

## 2016-05-29 MED ORDER — REGADENOSON 0.4 MG/5ML IV SOLN
0.4000 mg | Freq: Once | INTRAVENOUS | Status: AC
  Administered 2016-05-29: 0.4 mg via INTRAVENOUS

## 2016-05-29 MED ORDER — TECHNETIUM TC 99M TETROFOSMIN IV KIT
10.4000 | PACK | Freq: Once | INTRAVENOUS | Status: AC | PRN
  Administered 2016-05-29: 10.4 via INTRAVENOUS
  Filled 2016-05-29: qty 11

## 2016-05-30 ENCOUNTER — Inpatient Hospital Stay (HOSPITAL_COMMUNITY): Admission: RE | Admit: 2016-05-30 | Payer: Medicare Other | Source: Ambulatory Visit

## 2016-06-07 ENCOUNTER — Inpatient Hospital Stay (HOSPITAL_COMMUNITY): Admission: RE | Admit: 2016-06-07 | Payer: Medicare Other | Source: Ambulatory Visit

## 2016-12-09 ENCOUNTER — Encounter: Payer: Self-pay | Admitting: Internal Medicine

## 2016-12-10 ENCOUNTER — Ambulatory Visit: Payer: Medicare Other | Admitting: Internal Medicine

## 2016-12-11 ENCOUNTER — Encounter: Payer: Self-pay | Admitting: Internal Medicine

## 2017-10-17 DEATH — deceased

## 2018-04-23 IMAGING — NM NM MISC PROCEDURE
6 series · 36 of 36 positions shown · non-contrast
Comparison: none

[Series 1: wbr rest · 6.40mm/px · 6 of 64 frames shown]
[frame 6/64]
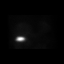
[frame 16/64]
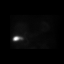
[frame 27/64]
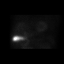
[frame 38/64]
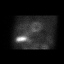
[frame 48/64]
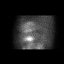
[frame 59/64]
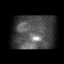

[Series 1: wbr_r-proj_st wbr rest · 6.40mm/px · 6 of 64 frames shown]
[frame 6/64]
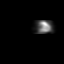
[frame 16/64]
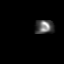
[frame 27/64]
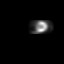
[frame 38/64]
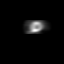
[frame 48/64]
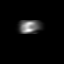
[frame 59/64]
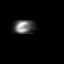

[Series 2: wbr_s-proj_st wbr stress-gsp · 6.40mm/px · 6 of 512 frames shown]
[frame 43/512]
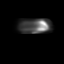
[frame 128/512]
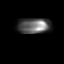
[frame 214/512]
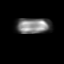
[frame 299/512]
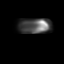
[frame 384/512]
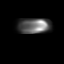
[frame 470/512]
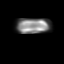

[Series 2: wbr stress-gsp · 6.40mm/px · 6 of 512 frames shown]
[frame 43/512]
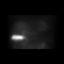
[frame 128/512]
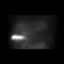
[frame 214/512]
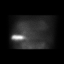
[frame 299/512]
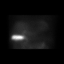
[frame 384/512]
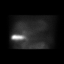
[frame 470/512]
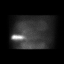

[Series 3: wbr_s-proj_st wbr stress-sum-em · 6.40mm/px · 6 of 64 frames shown]
[frame 6/64]
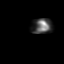
[frame 16/64]
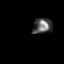
[frame 27/64]
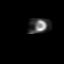
[frame 38/64]
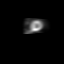
[frame 48/64]
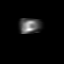
[frame 59/64]
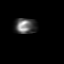

[Series 3: wbr stress-sum-em · 6.40mm/px · 6 of 64 frames shown]
[frame 6/64]
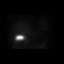
[frame 16/64]
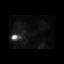
[frame 27/64]
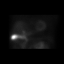
[frame 38/64]
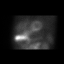
[frame 48/64]
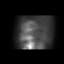
[frame 59/64]
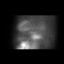

[36 of 36 positions shown; findings below may reference images not displayed]

Canned report from images found in remote index.

Refer to host system for actual result text.
